# Patient Record
Sex: Male | Born: 1945 | Hispanic: No | Smoking: Former smoker
Health system: Southern US, Community
[De-identification: ages and names within clinical notes are randomized; demographics above are authoritative.]

## PROBLEM LIST (undated history)

## (undated) DIAGNOSIS — S72009A Fracture of unspecified part of neck of unspecified femur, initial encounter for closed fracture: Secondary | ICD-10-CM

## (undated) DIAGNOSIS — K219 Gastro-esophageal reflux disease without esophagitis: Secondary | ICD-10-CM

## (undated) DIAGNOSIS — M549 Dorsalgia, unspecified: Secondary | ICD-10-CM

## (undated) DIAGNOSIS — F419 Anxiety disorder, unspecified: Secondary | ICD-10-CM

## (undated) DIAGNOSIS — N429 Disorder of prostate, unspecified: Secondary | ICD-10-CM

## (undated) DIAGNOSIS — D696 Thrombocytopenia, unspecified: Secondary | ICD-10-CM

## (undated) DIAGNOSIS — I739 Peripheral vascular disease, unspecified: Secondary | ICD-10-CM

## (undated) HISTORY — PX: EYE SURGERY: SHX253

## (undated) HISTORY — DX: Thrombocytopenia, unspecified: D69.6

## (undated) HISTORY — PX: OTHER SURGICAL HISTORY: SHX169

## (undated) HISTORY — DX: Fracture of unspecified part of neck of unspecified femur, initial encounter for closed fracture: S72.009A

## (undated) HISTORY — PX: ANKLE SURGERY: SHX546

---

## 2011-01-07 HISTORY — PX: COLONOSCOPY: SHX174

## 2012-06-19 ENCOUNTER — Emergency Department (HOSPITAL_COMMUNITY): Payer: Medicare (Managed Care)

## 2012-06-19 ENCOUNTER — Encounter (HOSPITAL_COMMUNITY): Payer: Self-pay | Admitting: Physical Medicine and Rehabilitation

## 2012-06-19 ENCOUNTER — Observation Stay (HOSPITAL_COMMUNITY)
Admission: EM | Admit: 2012-06-19 | Discharge: 2012-06-21 | Disposition: A | Discharge status: Home / Self Care | Payer: Medicare (Managed Care) | Attending: Internal Medicine | Admitting: Internal Medicine

## 2012-06-19 DIAGNOSIS — N429 Disorder of prostate, unspecified: Secondary | ICD-10-CM

## 2012-06-19 DIAGNOSIS — R079 Chest pain, unspecified: Principal | ICD-10-CM

## 2012-06-19 DIAGNOSIS — G8929 Other chronic pain: Secondary | ICD-10-CM | POA: Insufficient documentation

## 2012-06-19 DIAGNOSIS — F419 Anxiety disorder, unspecified: Secondary | ICD-10-CM

## 2012-06-19 DIAGNOSIS — F411 Generalized anxiety disorder: Secondary | ICD-10-CM

## 2012-06-19 DIAGNOSIS — R0602 Shortness of breath: Secondary | ICD-10-CM

## 2012-06-19 DIAGNOSIS — I739 Peripheral vascular disease, unspecified: Secondary | ICD-10-CM

## 2012-06-19 DIAGNOSIS — K219 Gastro-esophageal reflux disease without esophagitis: Secondary | ICD-10-CM

## 2012-06-19 DIAGNOSIS — M79609 Pain in unspecified limb: Secondary | ICD-10-CM

## 2012-06-19 DIAGNOSIS — I7300 Raynaud's syndrome without gangrene: Secondary | ICD-10-CM

## 2012-06-19 DIAGNOSIS — K0381 Cracked tooth: Secondary | ICD-10-CM | POA: Insufficient documentation

## 2012-06-19 DIAGNOSIS — R911 Solitary pulmonary nodule: Secondary | ICD-10-CM | POA: Insufficient documentation

## 2012-06-19 HISTORY — DX: Anxiety disorder, unspecified: F41.9

## 2012-06-19 HISTORY — DX: Peripheral vascular disease, unspecified: I73.9

## 2012-06-19 HISTORY — DX: Gastro-esophageal reflux disease without esophagitis: K21.9

## 2012-06-19 HISTORY — DX: Disorder of prostate, unspecified: N42.9

## 2012-06-19 LAB — CBC WITH DIFFERENTIAL/PLATELET
Eosinophils Absolute: 0.1 10*3/uL (ref 0.0–0.7)
Hemoglobin: 14.6 g/dL (ref 13.0–17.0)
Lymphocytes Relative: 26 % (ref 12–46)
Lymphs Abs: 1.3 10*3/uL (ref 0.7–4.0)
MCH: 30.6 pg (ref 26.0–34.0)
Monocytes Relative: 7 % (ref 3–12)
Neutro Abs: 3.3 10*3/uL (ref 1.7–7.7)
Neutrophils Relative %: 65 % (ref 43–77)
Platelets: 152 10*3/uL (ref 150–400)
RBC: 4.77 MIL/uL (ref 4.22–5.81)
WBC: 5 10*3/uL (ref 4.0–10.5)

## 2012-06-19 LAB — BASIC METABOLIC PANEL
BUN: 19 mg/dL (ref 6–23)
Chloride: 101 mEq/L (ref 96–112)
GFR calc non Af Amer: 75 mL/min — ABNORMAL LOW (ref >90)
Glucose, Bld: 100 mg/dL — ABNORMAL HIGH (ref 70–99)
Potassium: 4.6 mEq/L (ref 3.5–5.1)
Sodium: 137 mEq/L (ref 135–145)

## 2012-06-19 LAB — TROPONIN I: Troponin I: <0.3 ng/mL (ref <0.30)

## 2012-06-19 LAB — POCT I-STAT TROPONIN I

## 2012-06-19 LAB — POCT I-STAT 3, ART BLOOD GAS (G3+)
Acid-Base Excess: 1 mmol/L (ref 0.0–2.0)
Bicarbonate: 25.1 mEq/L — ABNORMAL HIGH (ref 20.0–24.0)
Patient temperature: 98.6
pH, Arterial: 7.418 (ref 7.350–7.450)
pO2, Arterial: 87 mmHg (ref 80.0–100.0)

## 2012-06-19 MED ORDER — ENOXAPARIN SODIUM 30 MG/0.3ML ~~LOC~~ SOLN: 30.0000 mg | SUBCUTANEOUS | Status: DC

## 2012-06-19 MED ORDER — GABAPENTIN 600 MG PO TABS
600.0000 mg | ORAL_TABLET | Freq: Every day | ORAL | Status: DC
  Administered 2012-06-19 – 2012-06-20 (×2): 600 mg via ORAL
  Filled 2012-06-19 (×3): qty 1

## 2012-06-19 MED ORDER — GI COCKTAIL ~~LOC~~
30.0000 mL | Freq: Two times a day (BID) | ORAL | Status: DC | PRN
  Administered 2012-06-21: 30 mL via ORAL
  Filled 2012-06-19: qty 30

## 2012-06-19 MED ORDER — ENOXAPARIN SODIUM 40 MG/0.4ML ~~LOC~~ SOLN
40.0000 mg | SUBCUTANEOUS | Status: DC
  Administered 2012-06-19 – 2012-06-20 (×2): 40 mg via SUBCUTANEOUS
  Filled 2012-06-19 (×3): qty 0.4

## 2012-06-19 MED ORDER — IOHEXOL 350 MG/ML SOLN
100.0000 mL | Freq: Once | INTRAVENOUS | Status: AC | PRN
  Administered 2012-06-19: 100 mL via INTRAVENOUS

## 2012-06-19 MED ORDER — ACETAMINOPHEN 325 MG PO TABS
650.0000 mg | ORAL_TABLET | ORAL | Status: DC | PRN
  Administered 2012-06-21: 650 mg via ORAL
  Filled 2012-06-19: qty 2

## 2012-06-19 MED ORDER — PANTOPRAZOLE SODIUM 40 MG PO TBEC
40.0000 mg | DELAYED_RELEASE_TABLET | Freq: Every day | ORAL | Status: DC
  Administered 2012-06-20 – 2012-06-21 (×2): 40 mg via ORAL
  Filled 2012-06-19 (×2): qty 1

## 2012-06-19 MED ORDER — NITROGLYCERIN 0.4 MG SL SUBL: 0.4000 mg | SUBLINGUAL_TABLET | SUBLINGUAL | Status: DC | PRN

## 2012-06-19 MED ORDER — TRAMADOL HCL 50 MG PO TABS
50.0000 mg | ORAL_TABLET | Freq: Four times a day (QID) | ORAL | Status: DC | PRN
  Administered 2012-06-20 – 2012-06-21 (×3): 50 mg via ORAL
  Filled 2012-06-19 (×4): qty 1

## 2012-06-19 MED ORDER — ONDANSETRON HCL 4 MG/2ML IJ SOLN: 4.0000 mg | Freq: Four times a day (QID) | INTRAMUSCULAR | Status: DC | PRN

## 2012-06-19 MED ORDER — SODIUM CHLORIDE 0.9 % IV SOLN
INTRAVENOUS | Status: AC
  Administered 2012-06-19: 21:00:00 via INTRAVENOUS

## 2012-06-19 MED ORDER — ASPIRIN EC 81 MG PO TBEC
81.0000 mg | DELAYED_RELEASE_TABLET | Freq: Every day | ORAL | Status: DC
  Administered 2012-06-20 – 2012-06-21 (×2): 81 mg via ORAL
  Filled 2012-06-19 (×2): qty 1

## 2012-06-19 NOTE — H&P (Signed)
Date: 06/19/2012               Patient Name:  Mark Hansen MRN: [id number]  DOB: Mar 11, 1946 Age / Sex: 67 y.o., male   PCP: Melida Gimenez              Medical Service: Internal Medicine Teaching Service              Attending Physician: Dr. Josem Kaufmann    First Contact: Dr. Collier Bullock Pager: [phone]  Second Contact: Dr. Manson Passey Pager: [phone]            After Hours (After 5p/  First Contact Pager: [phone]  weekends / holidays): Second Contact Pager: [phone]     Chief Complaint: Chest pain  History of Present Illness: Patient is a 67 y.o. male with a PMHx of PVD, anxiety, and GERD who presents to [facility] for evaluation of 3 day history of substernal chest pressure. Patient indicates that initially he had lower left tooth pain. He attempted MoM at home without relief and then took his "nerve medicine" - with initial relief. Then, 3 days ago, he developed intermittent episodes of pressure-like epigastric and chest discomfort occuring about 4-5 times a day, lasting typically 25 minutes at a time. This discomfort occurs during the day and is not experienced nocturnally. He also described associated shortness of breath, that was worse with laying flat. He reports that discomfort is better with eating, taking celebrex, tramadol and taking his nerve medicine. Symptoms not provoked by exertion.  Patient and wife at bedside report anxiety ramping up daily at home due to concern for his infected tooth. Wife is concerned that patient's symptoms caused by panic, as they seem to occur when he is at his most anxious.  He has had no syncope. Reports that he has recently started using bilateral hand dumbells for exercise over the past 2 weeks. Is tolerating 30 min of exercise daily without SOB. Denies N/V or diaphoresis with episodes.  Patient had similar but not as severe symptoms approximately 20 years ago, for which he was evaluated at [facility]. He reports that work up "did not reveal," what was  causing his symptoms. Has not had interim symptoms since.    Review of Systems: Constitutional:  denies fever, chills, diaphoresis, appetite change and fatigue.  HEENT: denies HA, hearing loss, ear pain, congestion, rhinorrhea  Respiratory: admits to SOB, DOE, chest tightness. Denies and wheezing.  Cardiovascular: Denies chest pain, palpitations and leg swelling.  Gastrointestinal: admits to reflux Denies dysphagia, nausea, vomiting, abdominal pain, diarrhea, constipation, blood in stool.  Genitourinary: denies dysuria, urgency, frequency, hematuria, flank pain and difficulty urinating.  Musculoskeletal: denies  myalgias, joint swelling, arthralgias and gait problem.   Skin: admits to rash   Neurological: denies dizziness, syncope, weakness, light-headedness, numbness  Hematological: denies adenopathy, easy bruising   Psychiatric/ Behavioral: admits to increasing anxiety Denies acute stressors, worsening anxiety    Current Outpatient Medications: Current Facility-Administered Medications  Medication Dose Route Frequency Provider Last Rate Last Dose  . 0.9 %  sodium chloride infusion   Intravenous STAT Carleene Cooper III, MD      . acetaminophen (TYLENOL) tablet 650 mg  650 mg Oral Q4H PRN Priscella Mann, DO      . [START ON 06/20/2012] aspirin EC tablet 81 mg  81 mg Oral Daily Maitri S Kalia-Reynolds, DO      . enoxaparin (LOVENOX) injection 30 mg  30 mg Subcutaneous Q24H Maitri S Kalia-Reynolds, DO      .  gabapentin (NEURONTIN) tablet 600 mg  600 mg Oral QHS Maitri S Kalia-Reynolds, DO      . nitroGLYCERIN (NITROSTAT) SL tablet 0.4 mg  0.4 mg Sublingual Q5 min PRN Carleene Cooper III, MD      . ondansetron [facility]) injection 4 mg  4 mg Intravenous Q6H PRN Priscella Mann, DO      . [START ON 06/20/2012] pantoprazole (PROTONIX) EC tablet 40 mg  40 mg Oral Daily Maitri S Kalia-Reynolds, DO      . traMADol (ULTRAM) tablet 50 mg  50 mg Oral Q6H PRN Maitri S Kalia-Reynolds, DO         Allergies: No Known Allergies   Past Medical History: Past Medical History  Diagnosis Date  . Anxiety   . GERD (gastroesophageal reflux disease)   . Peripheral vascular disease   . Prostate disease     Past Surgical History: Past Surgical History  Procedure Laterality Date  . Other surgical history      unspecified type of prostate surgery  . Ankle surgery      bilateral ankle surgery    Family History: Family History  Problem Relation Age of Onset  . CAD Brother 50    requiring bypass  . CAD Mother 67  . Asthma Father   . Kidney disease      cousin    Social History: History   Social History  . Marital Status: Married    Spouse Name: N/A    Number of Children: N/A  . Years of Education: N/A   Occupational History  . Disabled     secondary to accident at work several years ago.   Social History Main Topics  . Smoking status: Former Smoker -- 1.00 packs/day for 35 years    Types: Cigarettes  . Smokeless tobacco: Not on file  . 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  . Drug Use: No  . Sexually Active: Not on file   Other Topics Concern  . Not on file   Social History Narrative   Lives in [place] with his wife. Formerly worked in [employer], commissioning. Emigrated from [place] when he was 67 years old.     Vital Signs: Blood pressure 123/60, pulse 55, temperature 98.1 F (36.7 C), temperature source Oral, resp. rate 18, height 5\' 3"  (1.6 m), weight 127 lb 9.6 oz (57.879 kg), SpO2 100.00%.  Physical Exam: General: Vital signs reviewed and noted. Thin male in no acute distress; alert, appropriate and cooperative throughout examination.  Head: Normocephalic, atraumatic.  Eyes: PERRL, EOMI, No signs of anemia or jaundince.  Mouth/Throat: No abscesses apprecaited. Oropharynx nonerythematous, no exudate appreciated.   Neck: No deformities, masses, or tenderness noted.Supple, no JVD.  Lungs:  Normal respiratory effort.  Clear to auscultation BL without crackles or wheezes.  Trunk: Palpation of chest wall does not reproduce pain. Some pink papules over L lower back, no vesicular changes or TTP.  Heart: RRR. S1 and S2 normal without gallop, murmur, or rubs.  Abdomen:  No TTP of abdomen. BS normoactive. Soft, non distended.  No masses or organomegaly.  Extremities: 2+ DP pulses bilaterally. No pretibial edema.  Neurologic: A&O X3, CN II - XII are grossly intact. Motor strength is 5/5 in the all 4 extremities, Sensations intact to light touch, Cerebellar signs negative.   Lab results:  CURRENT LABS: CBC:    Component Value Date/Time   WBC 5.0 06/19/2012 1352   HGB  14.6 06/19/2012 1352   HCT 40.6 06/19/2012 1352   PLT 152 06/19/2012 1352   MCV 85.1 06/19/2012 1352   NEUTROABS 3.3 06/19/2012 1352   LYMPHSABS 1.3 06/19/2012 1352   MONOABS 0.3 06/19/2012 1352   EOSABS 0.1 06/19/2012 1352   BASOSABS 0.0 06/19/2012 1352     Metabolic Panel:    Component Value Date/Time   NA 137 06/19/2012 1352   K 4.6 06/19/2012 1352   CL 101 06/19/2012 1352   CO2 29 06/19/2012 1352   BUN 19 06/19/2012 1352   CREATININE 1.01 06/19/2012 1352   GLUCOSE 100* 06/19/2012 1352   CALCIUM 9.2 06/19/2012 1352    Troponin (Point of Care Test)  Recent Labs  06/19/12 1410  TROPIPOC 0.00    Imaging results:   Dg Chest 2 View (06/19/2012) - No acute infiltrate or pulmonary edema.  Minimal interstitial prominence bilaterally.   Original Report Authenticated By: Natasha Mead, M.D.    Ct Angio Chest W/cm &/or Wo Cm (06/19/2012) - 1.  No CT findings for pulmonary embolism. 2.  Emphysematous changes but no acute pulmonary findings.   Original Report Authenticated By: Rudie Meyer, M.D.    Other results:  EKG (06/19/2012) - Sinus bradycardia with HR 54, normal intervals, normal axis, TWI in lead AVL, no ST segment elevations/depressions. No prior for comparison  Assessment & Plan:  Pt is a 67 y.o. yo male with a PMHx of anxiety, GERD, and  peripheral vascular diseasewho was admitted on 06/19/2012 with symptoms of chest pressure and SOB, likely noncardiac in nature.    1) Chest pressure/SOB Patient presents with four days of intermittent chest pressure and SOB. Symptoms are nonexertional, and occur most often when recumbent. No EKG changes or elevated troponin to support ACS. He does have risk factors for CAD including age, smoking history, first degree relative with early onset CAD. No infiltrate on CXR, leukocytosis, fever or cough to suggest PNA. No orthopnea, LE edema or PND to support CHF. Patient had CTA in ED which was negative for PE. No evidence of Ludwig's angina, without abscess appreciated, no LAD or tenderness on neck.  Anxiety is likely also contributing to his symptoms, as symptoms are somewhat allayed after taking Klonipin. He and his wife report that his anxiety has been "out of control" over the past 3 days worrying about tooth pain.  Patient does have history of acid reflux and is taking celebrex, which may indicate GERD as diagnosis. His discomfort is primarily epigastric; however, he reports improvement of discomfort with eating. Improvement of pain with food could suggest duodenal ulcer disease. He is also at risk for gastric ulcer given celebrex use and former heavy smoking history. Smoking history, immigration history raise suspicion for H. Pylori. Denies any blood in his stool and no acute abdomen to suggest unstable bleeding ulcer. Of note, patient reports hospitalization in [employer] for similar symptoms 20 years ago, and reports that they "did not find the answer," at that time. Records not immediately available for review. It would be helpful to get in touch with his PCP during office hours to discuss his history and ascertain prior cardiac work-up.  - Admit to telemetry - Cycle CE - NTG prn - GI cocktail, PPI - Get in touch w PCP re: possible testing for H. Pylori as outpatient - PCP is Melida Gimenez, Chair  [facility], [place] [Ph [phone] Would touch base with PCP regarding coordination of further outpatient risk stratification for CAD, including possible treadmill EKG  2) Anxiety Patient cannot remember name of anxiolytic drug that he takes at home. Clonidine was recorded by pharmacy review, but patient is without HTN, suspect he is actually on Klonopin (clonazepam). Reports that he requires prn anxiolytic meds multiple times per day.  Suspect that he has GAD being treated with daily prn BZPs. Maintenance therapy with SSRI or TCA might be preferable.  - [facility] pharmacy closed now, recommend contact in am [phone])) regarding med reconciliation - Consider SSRI or TCA for GAD, caution SSRI use with tramadol.   3) GERD May be contributing to #1. Not on PPI at home. - Start pantoprazole - GI cocktail  4) Foot pain, chronic, in setting of PVD Holding celebrex now due to concern for possible GERD. Will continue neurontin and tramadol.   5) Solitary Pulmonary Nodule A 4 mm peripheral nodule within the posterior left upper lobe was identified during his CTA. Patient is at increased risk of bronchogenic carcinoma given smoking history. As such, follow-up chest CT at 1 year is recommended.  - Will need to communicate need to follow-up CT needs to PCP   DVT PPX - low molecular weight heparin  CODE STATUS - FULL   CONSULTS PLACED - none  DISPO .   Anticipated discharge in approximately 1 day.   The patient does have a current PCP Melida Gimenez, MD) and does not need an [facility] hospital follow-up appointment after discharge.    Is the [facility] hospital follow-up appointment a one-time only appointment? no.  Does the patient have transportation limitations that hinder transportation to clinic appointments? no.  SERVICE NEEDED AT DISCHARGE - TO BE DETERMINED DURING HOSPITAL COURSE         Y = Yes, Blank = No PT:   OT:   RN:   Equipment:   Other:    Signed: Bronson Curb, MD  PGY-1, Internal Medicine Resident Pager: [phone]  (7PM-7A) 06/19/2012, 8:59 PM

## 2012-06-19 NOTE — ED Notes (Signed)
Pt presents to department for evaluation of SOB and diffuse chest pain. Ongoing x4 days. 5/10 chest tightness at the time. Respirations unlabored. Speaking complete sentences. Skin warm and dry. Pt is alert and oriented x4.

## 2012-06-19 NOTE — ED Provider Notes (Signed)
History     CSN: [id number]  Arrival date & time 06/19/12  1343   First MD Initiated Contact with Patient 06/19/12 1513      Chief Complaint  Patient presents with  . Shortness of Breath  . Chest Pain    (Consider location/radiation/quality/duration/timing/severity/associated sxs/prior treatment) HPI Comments: Patient is a 67 year old man who complains of shortness of breath and chest pain for 4 days. He feels a pressure on his heart, like 2 hands holding his heart and putting pressure on it. He does not that he has had a toothache. He has experienced some cold sweats. 3 years ago he was evaluated for chest pain at a hospital in [facility] and apparently the workup was negative at that time. In 2004 he had fractures of both ankles. He used to smoke, but because his toes became blue in cold weather he was told to stop smoking. He notes blueness of his toes and fingers now. He takes Celebrex, clonidine, tramadol for pain, him gabapentin 600 mg at bedtime for pain in his legs. He is followed by Dr. Trina Ao in [facility].  Patient is a 66 y.o. male presenting with shortness of breath and chest pain. The history is provided by the patient and the spouse. No language interpreter was used.  Shortness of Breath Severity:  Moderate Onset quality:  Gradual Duration:  4 days Timing:  Constant Progression:  Worsening Chronicity:  New Context comment:  No specific cause. Relieved by:  Nothing Worsened by:  Nothing tried Ineffective treatments:  None tried Associated symptoms: chest pain   Associated symptoms: no fever   Associated symptoms comment:  He also feels chest pain, like a hand squeezing his heart from beneath, putting pressure on it. This also has been going on for 4 days. Risk factors: no hx of PE/DVT   Chest Pain Associated symptoms: shortness of breath   Associated symptoms: no fever     Past Medical History  Diagnosis Date  . Anxiety      No past surgical history on file.  History reviewed. No pertinent family history.  History  Substance Use Topics  . Smoking status: Never Smoker   . Smokeless tobacco: Not on file  . Alcohol Use: No      Review of Systems  Constitutional: Negative for fever and chills.       He has had some sweating on his forehead symptoms.  HENT: Negative.   Eyes: Negative.   Respiratory: Positive for chest tightness and shortness of breath.   Cardiovascular: Positive for chest pain.  Gastrointestinal: Negative.   Genitourinary: Negative.   Musculoskeletal:       Neuritic pain in ankles, for which he takes gabapentin.  Skin: Positive for color change.       His toes and fingers turn blue with cold exposure.  They are blue today.  Neurological: Negative.  Negative for seizures and syncope.  Psychiatric/Behavioral: The patient is nervous/anxious.     Allergies  Review of patient's allergies indicates no known allergies.  Home Medications   Current Outpatient Rx  Name  Route  Sig  Dispense  Refill  . celecoxib (CELEBREX) 200 MG capsule   Oral   Take 200 mg by mouth daily.         . cloNIDine (CATAPRES) 0.1 MG tablet   Oral   Take 0.1 mg by mouth 3 (three) times daily as needed (Anxiety).         . traMADol (ULTRAM) 50  MG tablet   Oral   Take 50 mg by mouth every 6 (six) hours as needed for pain.           BP 119/63  Pulse 53  Temp(Src) 97.7 F (36.5 C) (Oral)  Resp 20  SpO2 100%  Physical Exam  Nursing note and vitals reviewed. Constitutional: He is oriented to person, place, and time.  Slender elderly man, appears anxious.  HENT:  Head: Normocephalic and atraumatic.  Right Ear: External ear normal.  Left Ear: External ear normal.  Mouth/Throat: Oropharynx is clear and moist.  He has poor dentition with a number of teeth missing, the left lower lateral incisor broken off at the root, and the left lower canine tooth very loose.  No gingival swelling,  redness or tenderness.  Eyes: Conjunctivae and EOM are normal. Pupils are equal, round, and reactive to light. No scleral icterus.  Neck: Normal range of motion. Neck supple. No JVD present.  Cardiovascular: Regular rhythm.   Bradycardic.  Pulmonary/Chest: Effort normal and breath sounds normal. No respiratory distress. He has no wheezes. He has no rales. He exhibits no tenderness.  Abdominal: Soft. Bowel sounds are normal.  Musculoskeletal:  Has cyanosis of fingers and toes.  Has intact radial and dorsalis pedis pulses bilaterally.  He has scars over the medial malleoli of both ankles from prior ORIF of ankle fractures.  No calf tenderness, no Homans' sign.  Lymphadenopathy:    He has no cervical adenopathy.  Neurological: He is alert and oriented to person, place, and time. He has normal reflexes.  Skin: Skin is warm and dry. Rash noted.  He has a sparsely distributed fine red macular rash on the left side of the neck on on the left posterior chest.  Psychiatric:  Anxious, otherwise WNL.    ED Course  Procedures (including critical care time)  Labs Reviewed  BASIC METABOLIC PANEL - Abnormal; Notable for the following:    Glucose, Bld 100 (*)    GFR calc non Af Amer 75 (*)    GFR calc Af Amer 87 (*)    All other components within normal limits  CBC WITH DIFFERENTIAL  POCT I-STAT TROPONIN I   Dg Chest 2 View  06/19/2012  *RADIOLOGY REPORT*  Clinical Data: Chest pain, shortness of breath  CHEST - 2 VIEW  Comparison: None  Findings: Cardiomediastinal silhouette is unremarkable.  No acute infiltrate or pleural effusion.  No pulmonary edema.  Minimal interstitial prominence bilaterally.  IMPRESSION:  No acute infiltrate or pulmonary edema.  Minimal interstitial prominence bilaterally.   Original Report Authenticated By: Natasha Mead, M.D.     Date: 06/19/2012  Rate: 54  Rhythm: sinus bradycardia  QRS Axis: normal  Intervals: normal  ST/T Wave abnormalities: normal  Conduction  Disutrbances:none  Narrative Interpretation: Sinus bradycardia, otherwise WNL  Old EKG Reviewed: none available  3:48 PM Results for orders placed during the hospital encounter of 06/19/12  CBC WITH DIFFERENTIAL      Result Value Range   WBC 5.0  4.0 - 10.5 K/uL   RBC 4.77  4.22 - 5.81 MIL/uL   Hemoglobin 14.6  13.0 - 17.0 g/dL   HCT 16.1  09.6 - 04.5 %   MCV 85.1  78.0 - 100.0 fL   MCH 30.6  26.0 - 34.0 pg   MCHC 36.0  30.0 - 36.0 g/dL   RDW 40.9  81.1 - 91.4 %   Platelets 152  150 - 400 K/uL   Neutrophils Relative 65  43 - 77 %   Neutro Abs 3.3  1.7 - 7.7 K/uL   Lymphocytes Relative 26  12 - 46 %   Lymphs Abs 1.3  0.7 - 4.0 K/uL   Monocytes Relative 7  3 - 12 %   Monocytes Absolute 0.3  0.1 - 1.0 K/uL   Eosinophils Relative 3  0 - 5 %   Eosinophils Absolute 0.1  0.0 - 0.7 K/uL   Basophils Relative 0  0 - 1 %   Basophils Absolute 0.0  0.0 - 0.1 K/uL  BASIC METABOLIC PANEL      Result Value Range   Sodium 137  135 - 145 mEq/L   Potassium 4.6  3.5 - 5.1 mEq/L   Chloride 101  96 - 112 mEq/L   CO2 29  19 - 32 mEq/L   Glucose, Bld 100 (*) 70 - 99 mg/dL   BUN 19  6 - 23 mg/dL   Creatinine, Ser 1.61  0.50 - 1.35 mg/dL   Calcium 9.2  8.4 - 09.6 mg/dL   GFR calc non Af Amer 75 (*) >90 mL/min   GFR calc Af Amer 87 (*) >90 mL/min  POCT I-STAT TROPONIN I      Result Value Range   Troponin i, poc 0.00  0.00 - 0.08 ng/mL   Comment 3            Dg Chest 2 View  06/19/2012  *RADIOLOGY REPORT*  Clinical Data: Chest pain, shortness of breath  CHEST - 2 VIEW  Comparison: None  Findings: Cardiomediastinal silhouette is unremarkable.  No acute infiltrate or pleural effusion.  No pulmonary edema.  Minimal interstitial prominence bilaterally.  IMPRESSION:  No acute infiltrate or pulmonary edema.  Minimal interstitial prominence bilaterally.   Original Report Authenticated By: Natasha Mead, M.D.     Lab workup negative to this point.  Exam shows Raynaud's phenomenon.  Will repeat TNI, get room  air ABG and CT angio of chest to further his workupl  5:17 PM Repeat TNI negative.  ABG's WNL.  CT angio of chest pending.  6:30 PM CT angio of chest shows no PE, does show some emphysematous changes.   He continues to complain of chest pain.  Will ask unassigned medicine to admit him for chest pain observation.   6:44 PM Pt will be admitted to [facility], Dr. Josem Kaufmann attending, to an observation status, to a telemetry bed.  1. Chest pain   2. Shortness of breath   3. Raynaud's phenomenon             Carleene Cooper III, MD 06/19/12 [phone]

## 2012-06-20 DIAGNOSIS — R079 Chest pain, unspecified: Secondary | ICD-10-CM

## 2012-06-20 LAB — HEMOGLOBIN A1C: Hgb A1c MFr Bld: 5.6 % (ref <5.7)

## 2012-06-20 LAB — LIPID PANEL
Cholesterol: 130 mg/dL (ref 0–200)
Triglycerides: 52 mg/dL (ref <150)
VLDL: 10 mg/dL (ref 0–40)

## 2012-06-20 MED ORDER — RANITIDINE HCL 150 MG PO TABS: 150.0000 mg | ORAL_TABLET | Freq: Two times a day (BID) | ORAL | Status: DC

## 2012-06-20 NOTE — Progress Notes (Signed)
Utilization review completed.  

## 2012-06-20 NOTE — Progress Notes (Signed)
Pt sinus brady in the upper 40's while sleeping. Pt asymptomatic at this time. MD on-call made aware. No new orders given. Will continue to monitor.  Harless Litten, RN 06/20/12

## 2012-06-20 NOTE — H&P (Signed)
Internal Medicine Attending Admission Note Date: 06/20/2012  Patient name: Mark Hansen Medical record number: [id number] Date of birth: 1946-01-31 Age: 67 y.o. Gender: male  I saw and evaluated the patient. I reviewed the resident's note and I agree with the resident's findings and plan as documented in the resident's note.  Chief Complaint(s): Chest squeezing x 3 days.  History - key components related to admission:  Mark Hansen is a 67 year old man with a history of peripheral vascular occlusive disease, tobacco abuse, gastroesophageal reflux disease, and anxiety who presents with 3 days of chest squeezing. He was in his usual state of health until 3 days ago when he noted drainage from one of his teeth. He became very nervous about this and subsequently developed heaviness with breathing and a squeezing sensation substernally. The pain did not radiate, was not associated with exertion, diaphoresis, dizziness, or nausea. The frequency of the pain was 4-5 times per day with each episode lasting 25 minutes or so. He had no pain at night. The pain was made worse with lying flat and was improved with eating, taking his tramadol, Celebrex, or Klonopin. He had similar symptoms approximately 20 years ago for which he was evaluated in [place] and no specific etiology was found. Cardiac risk factors include age, gender, history of tobacco abuse, family history of premature coronary artery disease, and presence of peripheral vascular occlusive disease. He denies a personal history of diabetes, hypertension, or hyperlipidemia.  When he was seen this morning he also noted some "heavy breathing" after eating breakfast but this was not associated with chest squeezing or other symptoms. During our conversation he appeared much more comfortable from a breathing standpoint. When I asked about his anxiety he admitted it was quite high given his concern about his tooth. Although he occasionally feels blue he says  this is only intermittent and is currently not the case now. He is without other complaints at this time.  Physical Exam - key components related to admission:  Filed Vitals:   06/19/12 1830 06/19/12 1915 06/19/12 2030 06/20/12 0500  BP: 129/65 123/60 147/79 138/78  Pulse: 59 55 59 58  Temp:   98.2 F (36.8 C) 98.1 F (36.7 C)  TempSrc:      Resp:   18 16  Height:   5\' 3"  (1.6 m)   Weight:   127 lb 9.6 oz (57.879 kg)   SpO2: 100% 100% 99% 96%   Gen.: Well-developed, well-nourished, man lying comfortably in bed in no acute distress. Of note, he is breathing comfortably. Lungs: Clear to auscultation bilaterally without wheezes, rhonchi, or rales. Heart: Regular rate and rhythm without murmurs, rubs, or gallops. Abdomen: Soft, nontender, active bowel sounds. Extremities: 1+ dorsalis pedis pulses bilaterally, warm, without edema.  Lab results:  Basic Metabolic Panel:  Recent Labs  03/47/42 1352 06/19/12 2122  NA 137  --   K 4.6  --   CL 101  --   CO2 29  --   GLUCOSE 100*  --   BUN 19  --   CREATININE 1.01  --   CALCIUM 9.2  --   MG  --  2.0   CBC:  Recent Labs  06/19/12 1352  WBC 5.0  NEUTROABS 3.3  HGB 14.6  HCT 40.6  MCV 85.1  PLT 152   Cardiac Enzymes:  Recent Labs  06/19/12 1552 06/19/12 2122 06/20/12 0303  TROPONINI <0.30 <0.30 <0.30   Fasting Lipid Panel:  Recent Labs  06/20/12 0303  CHOL 130  HDL 41  LDLCALC 79  TRIG 52  CHOLHDL 3.2   Misc. Labs:  Presumably room air arterial blood gas: 7.42/39/87  Total cholesterol 130 Triglycerides 52 HDL 41 LDL 79  Imaging results:  Dg Chest 2 View  06/19/2012  *RADIOLOGY REPORT*  Clinical Data: Chest pain, shortness of breath  CHEST - 2 VIEW  Comparison: None  Findings: Cardiomediastinal silhouette is unremarkable.  No acute infiltrate or pleural effusion.  No pulmonary edema.  Minimal interstitial prominence bilaterally.  IMPRESSION:  No acute infiltrate or pulmonary edema.  Minimal  interstitial prominence bilaterally.   Original Report Authenticated By: Natasha Mead, M.D.    Ct Angio Chest W/cm &/or Wo Cm  06/19/2012  **ADDENDUM** CREATED: 06/19/2012 21:15:30  A 4 mm peripheral nodule within the posterior left upper lobe (image 23) is identified.  If the patient is at high risk for bronchogenic carcinoma, follow- up chest CT at 1 year is recommended.  If the patient is at low risk, no follow-up is needed.  This recommendation follows the consensus statement: Guidelines for Management of Small Pulmonary Nodules Detected on CT Scans:  A Statement from the Fleischner Society as published in Radiology 2005; 237:395-400.  **END ADDENDUM** SIGNED BY: Tinnie Gens T. Si Gaul, M.D.   06/19/2012  *RADIOLOGY REPORT*  Clinical Data: Shortness of breath and chest pain.  CT ANGIOGRAPHY CHEST  Technique:  Multidetector CT imaging of the chest using the standard protocol during bolus administration of intravenous contrast. Multiplanar reconstructed images including MIPs were obtained and reviewed to evaluate the vascular anatomy.  Contrast:  100 ml Omnipaque-300.  Comparison: None  Findings: The chest wall is unremarkable.  No supraclavicular or axillary mass or adenopathy.  The bony thorax is intact.  No destructive bone lesions or spinal canal compromise.  A few small hemangiomas are noted.  The heart is normal in size.  No pericardial effusion.  No mediastinal or hilar lymphadenopathy.  The aorta is normal in caliber.  The esophagus is grossly normal.  The pulmonary arterial tree is well opacified.  No filling defects to suggest pulmonary emboli.  Examination of the lung parenchyma demonstrates biapical scarring type changes.  There are mild underlying emphysematous changes.  No infiltrates, edema or effusions.  Patchy areas of subpleural atelectasis are noted.  The upper abdomen is unremarkable. A few tiny low attenuation liver lesions are likely benign cysts.  IMPRESSION:  1.  No CT findings for pulmonary  embolism. 2.  Emphysematous changes but no acute pulmonary findings.   Original Report Authenticated By: Rudie Meyer, M.D.    Other results:  EKG: Normal sinus bradycardia at 54 beats per minute, normal axis and intervals, small Q's in V1 through V2, no LVH, no ST-T changes, no comparisons available.  Assessment & Plan by Problem:  Mr. Mccadden is a 67 year old man with a history of peripheral vascular occlusive disease, tobacco abuse, gastroesophageal reflux disease, and anxiety who presents with a three-day history of chest squeezing associated with concern over a tooth. The chest pain is very atypical for a cardiac cause. The most likely cause of the pain and the heavy breathing is anxiety. That being said, he does have a coronary equivalent in his peripheral vascular occlusive disease as well as other cardiac risk factors and deserves risk stratification.  1) Chest pain: He has ruled out for a myocardial infarction with serial enzymes and ECGs. We will try to obtain an exercise treadmill to risk stratify him. If we cannot do this while an inpatient we will  recommend this be done as an outpatient if he and his primary care provider feel it is necessary. Because our concerns that this episode represents a cardiac cause is low we will not start aspirin, statins, or antianginals.  2) Anxiety: We will continue the Klonopin. Ideally, he may benefit from an SSRI. Unfortunately he is currently on tramadol and the combination would be the risk for serotonin syndrome if we added an SSRI. As the tramadol seems to be effective in controlling his foot pain where other medications were not, we will not start an SSRI at this time. His anxiety can be managed as an outpatient by his primary care provider.  3) Pleural-based nodule: 4 mm in size. As he has a history of smoking he is at higher risk for bronchogenic carcinoma. Therefore, per the Fleischner criteria, he should have a repeat CT scan in one year to  assure this is not a growing lesion. If there is no change in the 4 mm size at that time no further workup will be necessary. He has already quit smoking.  4) Disposition: If we are able to get the exercise treadmill today to risk stratify we will do so. If we cannot, he will be discharged home with followup in his primary care provider's office. Further decisions on risk stratification can be done there.

## 2012-06-20 NOTE — Discharge Summary (Signed)
Internal Medicine Teaching [facility] Discharge Note  Name: Mark Hansen MRN: [id number] DOB: [birth year] 67 y.o.  Date of Admission: 06/19/2012  1:52 PM Date of Discharge: 06/21/2012 Attending Physician: Rocco Serene, MD  Discharge Diagnosis: Principal Problem:   Chest pain Active Problems:   Anxiety   GERD (gastroesophageal reflux disease)   Peripheral vascular disease   Discharge Medications:   Medication List    TAKE these medications       celecoxib 200 MG capsule  Commonly known as:  CELEBREX  Take 200 mg by mouth daily.     gabapentin 600 MG tablet  Commonly known as:  NEURONTIN  Take 600 mg by mouth at bedtime.     pantoprazole 40 MG tablet  Commonly known as:  PROTONIX  Take 1 tablet (40 mg total) by mouth daily.     traMADol 50 MG tablet  Commonly known as:  ULTRAM  Take 50 mg by mouth every 6 (six) hours as needed for pain.        Disposition and follow-up:   Mr.Mark Hansen was discharged from [facility] in Stable condition.  At the hospital follow up visit please address the following  -Followup chest pain admission with risk stratification -GERD treatment and possible H. pylori breath test if symptoms persist -Anxiety management, possible SSRI -Followup pulmonary nodule and discuss with patient, recommend chest CT in 1 year per guidelines   Follow-up Appointments:     Follow-up Information   Follow up with Melida Gimenez, MD. (please call to make a hospital follow up appointment in 1-2 weeks)    Contact information:   [address] [place] [address] [phone]      Discharge Orders   Future Orders Complete By Expires     Diet - low sodium heart healthy  As directed     Increase activity slowly  As directed        Consultations:    Procedures Performed:  Dg Chest 2 View  06/19/2012  *RADIOLOGY REPORT*  Clinical Data: Chest pain, shortness of breath  CHEST - 2 VIEW  Comparison: None   Findings: Cardiomediastinal silhouette is unremarkable.  No acute infiltrate or pleural effusion.  No pulmonary edema.  Minimal interstitial prominence bilaterally.  IMPRESSION:  No acute infiltrate or pulmonary edema.  Minimal interstitial prominence bilaterally.   Original Report Authenticated By: Natasha Mead, M.D.    Ct Angio Chest W/cm &/or Wo Cm  06/19/2012  **ADDENDUM** CREATED: 06/19/2012 21:15:30  A 4 mm peripheral nodule within the posterior left upper lobe (image 23) is identified.  If the patient is at high risk for bronchogenic carcinoma, follow- up chest CT at 1 year is recommended.  If the patient is at low risk, no follow-up is needed.  This recommendation follows the consensus statement: Guidelines for Management of Small Pulmonary Nodules Detected on CT Scans:  A Statement from the Fleischner Society as published in Radiology 2005; 237:395-400.  **END ADDENDUM** SIGNED BY: Tinnie Gens T. Si Gaul, M.D.   06/19/2012  *RADIOLOGY REPORT*  Clinical Data: Shortness of breath and chest pain.  CT ANGIOGRAPHY CHEST  Technique:  Multidetector CT imaging of the chest using the standard protocol during bolus administration of intravenous contrast. Multiplanar reconstructed images including MIPs were obtained and reviewed to evaluate the vascular anatomy.  Contrast:  100 ml Omnipaque-300.  Comparison: None  Findings: The chest wall is unremarkable.  No supraclavicular or axillary mass or adenopathy.  The bony thorax is intact.  No  destructive bone lesions or spinal canal compromise.  A few small hemangiomas are noted.  The heart is normal in size.  No pericardial effusion.  No mediastinal or hilar lymphadenopathy.  The aorta is normal in caliber.  The esophagus is grossly normal.  The pulmonary arterial tree is well opacified.  No filling defects to suggest pulmonary emboli.  Examination of the lung parenchyma demonstrates biapical scarring type changes.  There are mild underlying emphysematous changes.  No infiltrates,  edema or effusions.  Patchy areas of subpleural atelectasis are noted.  The upper abdomen is unremarkable. A few tiny low attenuation liver lesions are likely benign cysts.  IMPRESSION:  1.  No CT findings for pulmonary embolism. 2.  Emphysematous changes but no acute pulmonary findings.   Original Report Authenticated By: Rudie Meyer, M.D.      Admission HPI: Patient is a 67 y.o. male with a PMHx of PVD, anxiety, and GERD who presents to [facility] for evaluation of 3 day history of substernal chest pressure. Patient indicates that initially he had lower left tooth pain. He attempted MoM at home without relief and then took his "nerve medicine" - with initial relief. Then, 3 days ago, he developed intermittent episodes of pressure-like epigastric and chest discomfort occuring about 4-5 times a day, lasting typically 25 minutes at a time. This discomfort occurs during the day and is not experienced nocturnally. He also described associated shortness of breath, that was worse with laying flat. He reports that discomfort is better with eating, taking celebrex, tramadol and taking his nerve medicine. Symptoms not provoked by exertion.  Patient and wife at bedside report anxiety ramping up daily at home due to concern for his infected tooth. Wife is concerned that patient's symptoms caused by panic, as they seem to occur when he is at his most anxious.  He has had no syncope. Reports that he has recently started using bilateral hand dumbells for exercise over the past 2 weeks. Is tolerating 30 min of exercise daily without SOB. Denies N/V or diaphoresis with episodes.  Patient had similar but not as severe symptoms approximately 20 years ago, for which he was evaluated at [facility]. He reports that work up "did not reveal," what was causing his symptoms. Has not had interim symptoms since.    Hospital Course by problem list: Principal Problem:   Chest pain Active Problems:   Anxiety   GERD  (gastroesophageal reflux disease)   Peripheral vascular disease    Chest pressure/SOB  Patient presented with chest pain and subjective shortness of breath. Serial cardiac enzymes were negative, EKGs were unrevealing for ST elevations or ischemic changes. Exercise stress test performed during this hospitalization was negative. Likely etiology is anxiety with some GERD symptoms.   Anxiety  Patient and his wife state that his anxiety has gotten worse recently. This is likely a major contributor factor to his presentation. He states he is on home klonopin. He will followup with his PCP for further management of his anxiety. May benefit from an SSRI for long-term treatment.  GERD  Patient had some burning chest pain as well as brackish taste in his mouth suggesting that GERD may be contributing to his presentation. He was started on pantoprazole during this hospitalization, and he was given a prescription to continue this medication at discharge. He should followup with his PCP for determining the treatment duration and possible further work up for GI causes of chest pain.   Solitary Pulmonary Nodule  A 4 mm  peripheral nodule within the posterior left upper lobe was identified on CT angiogram during the workup for patient's chest pain. Patient is at increased risk of bronchogenic carcinoma given smoking history. As such, follow-up chest CT at 1 year is recommended. We discussed these results with the patient.  Toothache  Patient presented with a broken tooth that is causing him significant discomfort. He does not have signs of infection or abscess. He should followup with a dentist immediately after discharge for repair or extraction of the affected tooth.   Discharge Vitals:  BP 171/85  Pulse 65  Temp(Src) 97.6 F (36.4 C) (Oral)  Resp 18  Ht 5\' 3"  (1.6 m)  Wt 126 lb 12.8 oz (57.516 kg)  BMI 22.47 kg/m2  SpO2 100%   Signed: Denton Ar 06/21/2012, 10:38 AM   Time Spent on Discharge:  35 minutes Services Ordered on Discharge: None Equipment Ordered on Discharge: None

## 2012-06-21 MED ORDER — TRAMADOL HCL 50 MG PO TABS
50.0000 mg | ORAL_TABLET | Freq: Once | ORAL | Status: AC
  Administered 2012-06-21: 50 mg via ORAL

## 2012-06-21 MED ORDER — CLONAZEPAM 0.5 MG PO TABS: 0.5000 mg | ORAL_TABLET | Freq: Once | ORAL | Status: DC | PRN

## 2012-06-21 MED ORDER — HYDROCODONE-ACETAMINOPHEN 5-325 MG PO TABS
1.0000 | ORAL_TABLET | Freq: Once | ORAL | Status: AC
  Administered 2012-06-21: 1 via ORAL
  Filled 2012-06-21: qty 1

## 2012-06-21 MED ORDER — PANTOPRAZOLE SODIUM 40 MG PO TBEC: 40.0000 mg | DELAYED_RELEASE_TABLET | Freq: Every day | ORAL | Status: DC

## 2012-06-21 MED ORDER — ALUM & MAG HYDROXIDE-SIMETH 200-200-20 MG/5ML PO SUSP
30.0000 mL | Freq: Once | ORAL | Status: AC
  Administered 2012-06-21: 30 mL via ORAL
  Filled 2012-06-21: qty 30

## 2012-06-21 NOTE — Progress Notes (Signed)
Reviewed discharge instructions with patient and wife, they stated their understanding.  Patient given information on indigestion and reinforced education of orange juice increasing acid and causing indigestion.  Patient discharged via wheelchair by volunteers home with wife.  Colman Cater

## 2012-06-21 NOTE — Progress Notes (Signed)
Internal Medicine Attending  Date: 06/21/2012  Patient name: Mark Hansen Medical record number: [id number] Date of birth: 1945/09/13 Age: 67 y.o. Gender: male  I saw and evaluated the patient. I reviewed the resident's note by Dr. Collier Bullock and I agree with the resident's findings and plans as documented in her progress note.  [name] felt well this morning. His exercise treadmill EKG test was without evidence of reversible ischemia. Therefore, his symptoms are most likely related to gastroesophageal reflux disease and overlying anxiety. He is stable for discharge this morning with followup in his primary care provider's office to address the reflux, anxiety, and very small pulmonary nodule.

## 2012-06-21 NOTE — Progress Notes (Signed)
Subjective: Patient developed a headache overnight. He states that he also had some right upper quadrant pain, sour taste in his mouth, and burning epigastric pain. Patient got a GI cocktail and Ultram, which alleviated the pain a little bit.  Denies chest pain, shortness of breath.  Objective: Vital signs in last 24 hours: Filed Vitals:   06/20/12 0500 06/20/12 1500 06/20/12 2100 06/21/12 0420  BP: 138/78 131/75 135/80 171/85  Pulse: 58 66 66 65  Temp: 98.1 F (36.7 C) 98.4 F (36.9 C) 98.3 F (36.8 C) 97.6 F (36.4 C)  TempSrc:  Oral Oral Oral  Resp: 16 18 15 18   Height:      Weight:    126 lb 12.8 oz (57.516 kg)  SpO2: 96% 99% 100% 100%   Weight change: -12.8 oz (-0.363 kg)  Intake/Output Summary (Last 24 hours) at 06/21/12 0847 Last data filed at 06/20/12 1900  Gross per 24 hour  Intake    300 ml  Output    350 ml  Net    -50 ml    Physical Exam Blood pressure 171/85, pulse 65, temperature 97.6 F (36.4 C), temperature source Oral, resp. rate 18, height 5\' 3"  (1.6 m), weight 126 lb 12.8 oz (57.516 kg), SpO2 100.00%. General:  No acute distress, alert and oriented x 3, well-appearing  HEENT:  PERRL, EOMI, moist mucous membranes, poor dentition with broken tooth and exposed root. No erythema or purulent drainage, no abscess. Cardiovascular:  Regular rate and rhythm, no murmurs, rubs or gallops Respiratory:  Clear to auscultation bilaterally, no wheezes, rales, or rhonchi Abdomen:  Soft, nondistended, nontender, bowel sounds present Extremities:  Warm and well-perfused, no edema. 2+ pulses. Skin: Warm, dry, no rashes Neuro: normal affect, slightly anxious appearing  Lab Results: Basic Metabolic Panel:  Recent Labs Lab 06/19/12 1352 06/19/12 2122  NA 137  --   K 4.6  --   CL 101  --   CO2 29  --   GLUCOSE 100*  --   BUN 19  --   CREATININE 1.01  --   CALCIUM 9.2  --   MG  --  2.0   CBC:  Recent Labs Lab 06/19/12 1352  WBC 5.0  NEUTROABS 3.3  HGB  14.6  HCT 40.6  MCV 85.1  PLT 152   Cardiac Enzymes:  Recent Labs Lab 06/19/12 2122 06/20/12 0303 06/20/12 1010  TROPONINI <0.30 <0.30 <0.30   Hemoglobin A1C:  Recent Labs Lab 06/19/12 2122  HGBA1C 5.6   Fasting Lipid Panel:  Recent Labs Lab 06/20/12 0303  CHOL 130  HDL 41  LDLCALC 79  TRIG 52  CHOLHDL 3.2   Thyroid Function Tests:  Recent Labs Lab 06/19/12 2122  TSH 3.177    Studies/Results: Dg Chest 2 View  06/19/2012  *RADIOLOGY REPORT*  Clinical Data: Chest pain, shortness of breath  CHEST - 2 VIEW  Comparison: None  Findings: Cardiomediastinal silhouette is unremarkable.  No acute infiltrate or pleural effusion.  No pulmonary edema.  Minimal interstitial prominence bilaterally.  IMPRESSION:  No acute infiltrate or pulmonary edema.  Minimal interstitial prominence bilaterally.   Original Report Authenticated By: Natasha Mead, M.D.    Ct Angio Chest W/cm &/or Wo Cm  06/19/2012  **ADDENDUM** CREATED: 06/19/2012 21:15:30  A 4 mm peripheral nodule within the posterior left upper lobe (image 23) is identified.  If the patient is at high risk for bronchogenic carcinoma, follow- up chest CT at 1 year is recommended.  If the patient is  at low risk, no follow-up is needed.  This recommendation follows the consensus statement: Guidelines for Management of Small Pulmonary Nodules Detected on CT Scans:  A Statement from the Fleischner Society as published in Radiology 2005; 237:395-400.  **END ADDENDUM** SIGNED BY: Tinnie Gens T. Si Gaul, M.D.   06/19/2012  *RADIOLOGY REPORT*  Clinical Data: Shortness of breath and chest pain.  CT ANGIOGRAPHY CHEST  Technique:  Multidetector CT imaging of the chest using the standard protocol during bolus administration of intravenous contrast. Multiplanar reconstructed images including MIPs were obtained and reviewed to evaluate the vascular anatomy.  Contrast:  100 ml Omnipaque-300.  Comparison: None  Findings: The chest wall is unremarkable.  No  supraclavicular or axillary mass or adenopathy.  The bony thorax is intact.  No destructive bone lesions or spinal canal compromise.  A few small hemangiomas are noted.  The heart is normal in size.  No pericardial effusion.  No mediastinal or hilar lymphadenopathy.  The aorta is normal in caliber.  The esophagus is grossly normal.  The pulmonary arterial tree is well opacified.  No filling defects to suggest pulmonary emboli.  Examination of the lung parenchyma demonstrates biapical scarring type changes.  There are mild underlying emphysematous changes.  No infiltrates, edema or effusions.  Patchy areas of subpleural atelectasis are noted.  The upper abdomen is unremarkable. A few tiny low attenuation liver lesions are likely benign cysts.  IMPRESSION:  1.  No CT findings for pulmonary embolism. 2.  Emphysematous changes but no acute pulmonary findings.   Original Report Authenticated By: Rudie Meyer, M.D.    Medications:  Medications reviewed  Scheduled Meds: . aspirin EC  81 mg Oral Daily  . enoxaparin (LOVENOX) injection  40 mg Subcutaneous Q24H  . gabapentin  600 mg Oral QHS  . pantoprazole  40 mg Oral Daily   Continuous Infusions:  PRN Meds:.acetaminophen, gi cocktail, nitroGLYCERIN, ondansetron (ZOFRAN) IV, traMADol  Assessment/Plan:  Pt is a 67 y.o. yo male with a PMHx of anxiety, GERD, and peripheral vascular diseasewho was admitted on 06/19/2012 with symptoms of chest pressure and SOB, likely noncardiac in nature.   1) Chest pressure/SOB  Exercise stress test was negative, CE negative. Likely etiology is anxiety with some GERD.  -dc today with protonix, back on home anxiety medications -PCP follow up next week - PCP is Melida Gimenez, [facility], [place] [Ph [phone]   2) Anxiety  Home klonopin  Further management per PCP  3) GERD  May be contributing to #1. Not on PPI at home. Had a sour taste and burning pain last night likely from GERD.  - Will  give Rx for pantoprazole  - f/u PCP  4) Foot pain, chronic, in setting of PVD  Will continue neurontin and tramadol.   5) Solitary Pulmonary Nodule  A 4 mm peripheral nodule within the posterior left upper lobe was identified during his CTA. Patient is at increased risk of bronchogenic carcinoma given smoking history. As such, follow-up chest CT at 1 year is recommended.  - Will need to communicate need to follow-up CT needs to PCP  6) Toothache Patient has a broken tooth that is causing him significant discomfort. He does not have signs of infection or abscess.   -f/u with dentist for repair  DVT PPX Lovenox  Dispo -dc today with PCP and dentistry follow up    LOS: 2 days   Denton Ar 06/21/2012, 8:47 AM

## 2012-06-21 NOTE — Progress Notes (Signed)
Pt complaining of "sour" taste and upper chest "burning". Pt given GI cocktail and ultram. Pt then complained of RUQ stabbing pain and "coolness" in his throat. Temp 97.6, BP 171/85, HR 65 NSR, O2 sats 100% on RA. MD on-call paged and made aware. Order given to give PRN tylenol, one time dose of 50mg  ultram PO, and to obtain AM EKG early. Pt stated that his pain decreased. Will continue to monitor.  Harless Litten, RN 06/21/12

## 2013-04-30 ENCOUNTER — Emergency Department (HOSPITAL_COMMUNITY)
Admission: EM | Admit: 2013-04-30 | Discharge: 2013-04-30 | Disposition: A | Discharge status: Home / Self Care | Payer: Medicare (Managed Care) | Attending: Emergency Medicine | Admitting: Emergency Medicine

## 2013-04-30 ENCOUNTER — Encounter (HOSPITAL_COMMUNITY): Payer: Self-pay | Admitting: Emergency Medicine

## 2013-04-30 DIAGNOSIS — M545 Low back pain, unspecified: Secondary | ICD-10-CM | POA: Insufficient documentation

## 2013-04-30 DIAGNOSIS — Z87891 Personal history of nicotine dependence: Secondary | ICD-10-CM | POA: Insufficient documentation

## 2013-04-30 DIAGNOSIS — F3289 Other specified depressive episodes: Secondary | ICD-10-CM | POA: Insufficient documentation

## 2013-04-30 DIAGNOSIS — Z87448 Personal history of other diseases of urinary system: Secondary | ICD-10-CM | POA: Insufficient documentation

## 2013-04-30 DIAGNOSIS — Z79899 Other long term (current) drug therapy: Secondary | ICD-10-CM | POA: Insufficient documentation

## 2013-04-30 DIAGNOSIS — Z8679 Personal history of other diseases of the circulatory system: Secondary | ICD-10-CM | POA: Insufficient documentation

## 2013-04-30 DIAGNOSIS — K219 Gastro-esophageal reflux disease without esophagitis: Secondary | ICD-10-CM | POA: Insufficient documentation

## 2013-04-30 DIAGNOSIS — G8929 Other chronic pain: Secondary | ICD-10-CM | POA: Insufficient documentation

## 2013-04-30 DIAGNOSIS — Z8739 Personal history of other diseases of the musculoskeletal system and connective tissue: Secondary | ICD-10-CM | POA: Insufficient documentation

## 2013-04-30 DIAGNOSIS — F329 Major depressive disorder, single episode, unspecified: Secondary | ICD-10-CM | POA: Insufficient documentation

## 2013-04-30 DIAGNOSIS — M549 Dorsalgia, unspecified: Secondary | ICD-10-CM

## 2013-04-30 HISTORY — DX: Dorsalgia, unspecified: M54.9

## 2013-04-30 MED ORDER — CELECOXIB 200 MG PO CAPS: 200.0000 mg | ORAL_CAPSULE | Freq: Every day | ORAL | Status: DC

## 2013-04-30 MED ORDER — MELOXICAM 7.5 MG PO TABS: 7.5000 mg | ORAL_TABLET | Freq: Every day | ORAL | Status: DC

## 2013-04-30 MED ORDER — OXYCODONE-ACETAMINOPHEN 5-325 MG PO TABS: 1.0000 | ORAL_TABLET | ORAL | Status: DC | PRN

## 2013-04-30 NOTE — Discharge Instructions (Signed)
Back Pain, Adult Low back pain is very common. About 1 in 5 people have back pain.The cause of low back pain is rarely dangerous. The pain often gets better over time.About half of people with a sudden onset of back pain feel better in just 2 weeks. About 8 in 10 people feel better by 6 weeks.  CAUSES Some common causes of back pain include:  Strain of the muscles or ligaments supporting the spine.  Wear and tear (degeneration) of the spinal discs.  Arthritis.  Direct injury to the back. DIAGNOSIS Most of the time, the direct cause of low back pain is not known.However, back pain can be treated effectively even when the exact cause of the pain is unknown.Answering your caregiver's questions about your overall health and symptoms is one of the most accurate ways to make sure the cause of your pain is not dangerous. If your caregiver needs more information, he or she may order lab work or imaging tests (X-rays or MRIs).However, even if imaging tests show changes in your back, this usually does not require surgery. HOME CARE INSTRUCTIONS For many people, back pain returns.Since low back pain is rarely dangerous, it is often a condition that people can learn to manageon their own.   Remain active. It is stressful on the back to sit or stand in one place. Do not sit, drive, or stand in one place for more than 30 minutes at a time. Take short walks on level surfaces as soon as pain allows.Try to increase the length of time you walk each day.  Do not stay in bed.Resting more than 1 or 2 days can delay your recovery.  Do not avoid exercise or work.Your body is made to move.It is not dangerous to be active, even though your back may hurt.Your back will likely heal faster if you return to being active before your pain is gone.  Pay attention to your body when you bend and lift. Many people have less discomfortwhen lifting if they bend their knees, keep the load close to their bodies,and  avoid twisting. Often, the most comfortable positions are those that put less stress on your recovering back.  Find a comfortable position to sleep. Use a firm mattress and lie on your side with your knees slightly bent. If you lie on your back, put a pillow under your knees.  Only take over-the-counter or prescription medicines as directed by your caregiver. Over-the-counter medicines to reduce pain and inflammation are often the most helpful.Your caregiver may prescribe muscle relaxant drugs.These medicines help dull your pain so you can more quickly return to your normal activities and healthy exercise.  Put ice on the injured area.  Put ice in a plastic bag.  Place a towel between your skin and the bag.  Leave the ice on for 15-20 minutes, 03-04 times a day for the first 2 to 3 days. After that, ice and heat may be alternated to reduce pain and spasms.  Ask your caregiver about trying back exercises and gentle massage. This may be of some benefit.  Avoid feeling anxious or stressed.Stress increases muscle tension and can worsen back pain.It is important to recognize when you are anxious or stressed and learn ways to manage it.Exercise is a great option. SEEK MEDICAL CARE IF:  You have pain that is not relieved with rest or medicine.  You have pain that does not improve in 1 week.  You have new symptoms.  You are generally not feeling well. SEEK   IMMEDIATE MEDICAL CARE IF:   You have pain that radiates from your back into your legs.  You develop new bowel or bladder control problems.  You have unusual weakness or numbness in your arms or legs.  You develop nausea or vomiting.  You develop abdominal pain.  You feel faint. Document Released: 04/13/2005 Document Revised: 10/13/2011 Document Reviewed: 09/01/2010 [employer] Patient Information 2014 [employer].  

## 2013-04-30 NOTE — ED Provider Notes (Signed)
CSN: [id number]     Arrival date & time 04/30/13  1122 History  This chart was scribed for non-physician practitioner, Elpidio AnisShari Keya Wynes, PA-C working with Raeford RazorStephen Kohut, MD by Greggory StallionKayla Andersen, ED scribe. This patient was seen in room WTR6/WTR6 and the patient's care was started at 12:40 PM.    Chief Complaint  Patient presents with  . Back Pain   The history is provided by the patient. No language interpreter was used.   HPI Comments: Mark Hansen is a 68 y.o. male who presents to the Emergency Department complaining of chronic lower back pain that has been ongoing for several years. He states he stopped taking the medications he was given from pain management 3 months ago. Pt states he started taking celebrex instead with relief but can no longer afford it. He takes gabapentin with some relief. Pt states the pain is worse in the morning. Hunching over can relieve some pain. Denies abdominal pain, bowel or bladder incontinence.    Past Medical History  Diagnosis Date  . Anxiety   . GERD (gastroesophageal reflux disease)   . Peripheral vascular disease   . Prostate disease   . Back pain    Past Surgical History  Procedure Laterality Date  . Other surgical history      unspecified type of prostate surgery  . Ankle surgery      bilateral ankle surgery   Family History  Problem Relation Age of Onset  . CAD Brother 50    requiring bypass  . CAD Mother 1855  . Asthma Father   . Kidney disease      cousin   History  Substance Use Topics  . Smoking status: Former Smoker -- 1.00 packs/day for 35 years    Types: Cigarettes  . Smokeless tobacco: Not on file  . 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    Review of Systems  Gastrointestinal: Negative for abdominal pain.  Genitourinary:       Negative for bowel or bladder incontinence.   Musculoskeletal: Positive for back pain and myalgias.  All other systems reviewed and are  negative.    Allergies  Review of patient's allergies indicates no known allergies.  Home Medications   Current Outpatient Rx  Name  Route  Sig  Dispense  Refill  . gabapentin (NEURONTIN) 600 MG tablet   Oral   Take 600 mg by mouth at bedtime.         . pantoprazole (PROTONIX) 40 MG tablet   Oral   Take 1 tablet (40 mg total) by mouth daily.   30 tablet   3   . traMADol (ULTRAM) 50 MG tablet   Oral   Take 50 mg by mouth every 6 (six) hours as needed for pain.          BP 133/99  Pulse 66  Temp(Src) 97.4 F (36.3 C) (Oral)  Resp 20  SpO2 100%  Physical Exam  Nursing note and vitals reviewed. Constitutional: He is oriented to person, place, and time. He appears well-developed and well-nourished.  Uncomfortable appearing, pacing, standing, leaning on furniture.  HENT:  Head: Normocephalic and atraumatic.  Eyes: EOM are normal.  Neck: Neck supple. No tracheal deviation present.  Cardiovascular: Normal rate.   Pulmonary/Chest: Effort normal. No respiratory distress.  Abdominal: Soft. There is no tenderness.  Musculoskeletal: Normal range of motion.  Non tender back. No swelling. Fully weight bearing.   Neurological:  He is alert and oriented to person, place, and time.  Skin: Skin is warm and dry.  Psychiatric: He has a normal mood and affect. His behavior is normal.    ED Course  Procedures (including critical care time)  DIAGNOSTIC STUDIES: Oxygen Saturation is 100% on RA, normal by my interpretation.    COORDINATION OF CARE: 12:47 PM-Discussed treatment plan which includes short course pain medication and trying to find an alternative to celebrex with pt at bedside and pt agreed to plan.   Labs Review Labs Reviewed - No data to display Imaging Review No results found.  EKG Interpretation   None       MDM  No diagnosis found. 1. Back pain, recurrent  Anti-inflammatories given for pain - generic Celebrex written, along with Mobic if Celebrex  unobtainable due to financial reasons. Percocet for interim relief.  I personally performed the services described in this documentation, which was scribed in my presence. The recorded information has been reviewed and is accurate.    Arnoldo Hooker, PA-C 05/13/13 [phone]

## 2013-04-30 NOTE — ED Notes (Signed)
Pt states he has chronic lower back pain. Is unable to afford celebrex which helps with back pain, as a result pain returned. Pt states pain is worse in am, pt hunches over in order to relieve pain.

## 2013-05-13 NOTE — ED Provider Notes (Signed)
Medical screening examination/treatment/procedure(s) were performed by non-physician practitioner and as supervising physician I was immediately available for consultation/collaboration.  EKG Interpretation   None        Aimie Wagman, MD 05/13/13 2339 

## 2013-05-28 ENCOUNTER — Emergency Department (HOSPITAL_COMMUNITY)
Admission: EM | Admit: 2013-05-28 | Discharge: 2013-05-28 | Disposition: A | Discharge status: Home / Self Care | Payer: Medicare (Managed Care) | Attending: Emergency Medicine | Admitting: Emergency Medicine

## 2013-05-28 ENCOUNTER — Encounter (HOSPITAL_COMMUNITY): Payer: Self-pay | Admitting: Emergency Medicine

## 2013-05-28 DIAGNOSIS — Z87891 Personal history of nicotine dependence: Secondary | ICD-10-CM | POA: Insufficient documentation

## 2013-05-28 DIAGNOSIS — Z8719 Personal history of other diseases of the digestive system: Secondary | ICD-10-CM | POA: Insufficient documentation

## 2013-05-28 DIAGNOSIS — L02519 Cutaneous abscess of unspecified hand: Secondary | ICD-10-CM | POA: Insufficient documentation

## 2013-05-28 DIAGNOSIS — L03011 Cellulitis of right finger: Secondary | ICD-10-CM

## 2013-05-28 DIAGNOSIS — F411 Generalized anxiety disorder: Secondary | ICD-10-CM | POA: Insufficient documentation

## 2013-05-28 DIAGNOSIS — I731 Thromboangiitis obliterans [Buerger's disease]: Secondary | ICD-10-CM

## 2013-05-28 DIAGNOSIS — I7300 Raynaud's syndrome without gangrene: Secondary | ICD-10-CM

## 2013-05-28 DIAGNOSIS — L03019 Cellulitis of unspecified finger: Secondary | ICD-10-CM | POA: Insufficient documentation

## 2013-05-28 DIAGNOSIS — Z791 Long term (current) use of non-steroidal anti-inflammatories (NSAID): Secondary | ICD-10-CM | POA: Insufficient documentation

## 2013-05-28 DIAGNOSIS — Z87448 Personal history of other diseases of urinary system: Secondary | ICD-10-CM | POA: Insufficient documentation

## 2013-05-28 DIAGNOSIS — Z79899 Other long term (current) drug therapy: Secondary | ICD-10-CM | POA: Insufficient documentation

## 2013-05-28 MED ORDER — CEPHALEXIN 500 MG PO CAPS: 500.0000 mg | ORAL_CAPSULE | Freq: Three times a day (TID) | ORAL | Status: DC

## 2013-05-28 MED ORDER — CEPHALEXIN 500 MG PO CAPS
500.0000 mg | ORAL_CAPSULE | Freq: Once | ORAL | Status: AC
  Administered 2013-05-28: 500 mg via ORAL
  Filled 2013-05-28: qty 1

## 2013-05-28 MED ORDER — ACETAMINOPHEN-CODEINE #3 300-30 MG PO TABS
2.0000 | ORAL_TABLET | Freq: Once | ORAL | Status: AC
  Administered 2013-05-28: 2 via ORAL
  Filled 2013-05-28: qty 2

## 2013-05-28 MED ORDER — ACETAMINOPHEN-CODEINE #3 300-30 MG PO TABS
1.0000 | ORAL_TABLET | Freq: Four times a day (QID) | ORAL | Status: DC | PRN
Start: 2013-05-28 — End: 2017-02-05

## 2013-05-28 NOTE — ED Provider Notes (Signed)
CSN: [id number]     Arrival date & time 05/28/13  0756 History   First MD Initiated Contact with Patient 05/28/13 [phone]     Chief Complaint  Patient presents with  . Hand Problem    right   (Consider location/radiation/quality/duration/timing/severity/associated sxs/prior Treatment) HPI Comments: Pt is here due to worsening swelling to middle 3 fingers of right hand.  Pt has generalized pain, fingers and hands are quite cold.  Skin is reddish more than left hand.  Pt is focally tender in one area of medial middle finger where he has seen some clear and white fluid drain.  He admits he has very dry skin and skin cracks and breaks often.  Pt has almost FROM, denies worse pain with bending and extension, but cannot make a complete fist due to the degree of swelling.  He was told a few years ago when similar happened to feet and toes, that he was told he must stop smoking or else they would have to amputate his toes. Pt has quit smoking completely since then.  No fevers, chills.  He reports when the weather is cold, his fingers and toes sometimes change colors.    The history is provided by the patient.    Past Medical History  Diagnosis Date  . Anxiety   . GERD (gastroesophageal reflux disease)   . Peripheral vascular disease   . Prostate disease   . Back pain    Past Surgical History  Procedure Laterality Date  . Other surgical history      unspecified type of prostate surgery  . Ankle surgery      bilateral ankle surgery   Family History  Problem Relation Age of Onset  . CAD Brother 50    requiring bypass  . CAD Mother 75  . Asthma Father   . Kidney disease      cousin   History  Substance Use Topics  . Smoking status: Former Smoker -- 1.00 packs/day for 35 years    Types: Cigarettes  . Smokeless tobacco: Not on file  . 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    Review of Systems  Constitutional: Negative for fever and chills.    Musculoskeletal: Positive for arthralgias and joint swelling.  Skin: Positive for color change and wound. Negative for rash.  Neurological: Negative for weakness and numbness.    Allergies  Review of patient's allergies indicates no known allergies.  Home Medications   Current Outpatient Rx  Name  Route  Sig  Dispense  Refill  . celecoxib (CELEBREX) 200 MG capsule   Oral   Take 1 capsule (200 mg total) by mouth daily.   30 capsule   2   . gabapentin (NEURONTIN) 600 MG tablet   Oral   Take 600 mg by mouth at bedtime.         Marland Kitchen acetaminophen-codeine (TYLENOL #3) 300-30 MG per tablet   Oral   Take 1-2 tablets by mouth every 6 (six) hours as needed for moderate pain.   15 tablet   0   . cephALEXin (KEFLEX) 500 MG capsule   Oral   Take 1 capsule (500 mg total) by mouth 3 (three) times daily.   30 capsule   0    BP 109/93  Pulse 70  Temp(Src) 97.5 F (36.4 C) (Oral)  Resp 17  SpO2 99% Physical Exam  Nursing note and vitals reviewed. Constitutional: He is  oriented to person, place, and time. He appears well-developed and well-nourished. No distress.  HENT:  Head: Normocephalic and atraumatic.  Eyes: Conjunctivae are normal. No scleral icterus.  Pulmonary/Chest: Effort normal. No respiratory distress.  Abdominal: Soft.  Musculoskeletal:       Right wrist: Normal. He exhibits normal range of motion and no tenderness.       Hands: Pt has a ring on 5th finger of right hand, I recommended to pt that he remove it.  2+ RP on both UE  Neurological: He is alert and oriented to person, place, and time.  Skin: Skin is warm. He is not diaphoretic. No pallor.    ED Course  Procedures (including critical care time) Labs Review Labs Reviewed - No data to display Imaging Review No results found.  EKG Interpretation   None      RA sat is 99% and I interpret to be normal    MDM   1. Buerger's disease   2. Raynaud disease   3. 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  Pt has quit smoking completely.  Pt may have Renaud's phenomen as well.  Pt needs to keep limbs warm, follow up with PCP and hand surgeon.  Will put pt on antibiotics for possible cellulitis, i don't see a specific abscess or area requiring immediate drainage. Only 2 portions of Kanavel's signs seen, held in partial flexion, swelling, but there is no tenderness over flexor sheath in particular, and no worsening pain with passive extension.        Shekinah Pitones Y. Oletta LamasGhGavin Poundim, MD 05/28/13 [phone]

## 2013-05-28 NOTE — ED Notes (Signed)
Pt denies injury to right hand but states hand has began to swell x 1 week. Pt states he has been putting Vaseline and peroxide on hands. Pt states pain in hand is a 6/10

## 2013-05-28 NOTE — Discharge Instructions (Signed)
Cellulitis Cellulitis is an infection of the skin and the tissue beneath it. The infected area is usually red and tender. Cellulitis occurs most often in the arms and lower legs.  CAUSES  Cellulitis is caused by bacteria that enter the skin through cracks or cuts in the skin. The most common types of bacteria that cause cellulitis are Staphylococcus and Streptococcus. SYMPTOMS   Redness and warmth.  Swelling.  Tenderness or pain.  Fever. DIAGNOSIS  Your caregiver can usually determine what is wrong based on a physical exam. Blood tests may also be done. TREATMENT  Treatment usually involves taking an antibiotic medicine. HOME CARE INSTRUCTIONS   Take your antibiotics as directed. Finish them even if you start to feel better.  Keep the infected arm or leg elevated to reduce swelling.  Apply a warm cloth to the affected area up to 4 times per day to relieve pain.  Only take over-the-counter or prescription medicines for pain, discomfort, or fever as directed by your caregiver.  Keep all follow-up appointments as directed by your caregiver. SEEK MEDICAL CARE IF:   You notice red streaks coming from the infected area.  Your red area gets larger or turns dark in color.  Your bone or joint underneath the infected area becomes painful after the skin has healed.  Your infection returns in the same area or another area.  You notice a swollen bump in the infected area.  You develop new symptoms. SEEK IMMEDIATE MEDICAL CARE IF:   You have a fever.  You feel very sleepy.  You develop vomiting or diarrhea.  You have a general ill feeling (malaise) with muscle aches and pains. MAKE SURE YOU:   Understand these instructions.  Will watch your condition.  Will get help right away if you are not doing well or get worse. Document Released: 01/21/2005 Document Revised: 10/13/2011 Document Reviewed: 06/29/2011 [place] Patient Information 2014 [employer], [place].    Raynaud's  Syndrome Raynaud's Syndrome is a disorder of the blood vessels in your hands and feet. It occurs when small arteries of the arms/hands or legs/feet become sensitive to cold or emotional upset. This causes the arteries to constrict, or narrow, and reduces blood flow to the area. The color in the fingers or toes changes from white to bluish to red and this is not usually painful. There may be numbness and tingling. Sores on the skin (ulcers) can form. Symptoms are usually relieved by warming. HOME CARE INSTRUCTIONS   Avoid exposure to cold. Keep your whole body warm and dry. Dress in layers. Wear mittens or gloves when handling ice or frozen food and when outdoors. Use holders for glasses or cans containing cold drinks. If possible, stay indoors during cold weather.  Limit your use of caffeine. Switch to decaffeinated coffee, tea, and soda pop. Avoid chocolate.  Avoid smoking or being around cigarette smoke. Smoke will make symptoms worse.  Wear loose fitting socks and comfortable, roomy shoes.  Avoid vibrating tools and machinery.  If possible, avoid stressful and emotional situations. Exercise, meditation and yoga may help you cope with stress. Biofeedback may be useful.  Ask your caregiver about medicine (calcium channel blockers) that may control Raynaud's phenomena. SEEK MEDICAL CARE IF:   Your discomfort becomes worse, despite conservative treatment.  You develop sores on your fingers and toes that do not heal. Document Released: 04/10/2000 Document Revised: 07/06/2011 Document Reviewed: 04/17/2008 [facility]eExitCare Patient Information 2014 [place]Care, Maryland[employer].   Narcotic and benzodiazepine use may cause drowsiness, slowed breathing or  dependence.  Please use with caution and do not drive, operate machinery or watch young children alone while taking them.  Taking combinations of these medications or drinking alcohol will potentiate these effects.

## 2017-02-05 ENCOUNTER — Emergency Department (HOSPITAL_COMMUNITY): Payer: Medicare (Managed Care)

## 2017-02-05 ENCOUNTER — Inpatient Hospital Stay (HOSPITAL_COMMUNITY)
Admission: EM | Admit: 2017-02-05 | Discharge: 2017-02-08 | DRG: 481 | Disposition: A | Discharge status: Skilled Nursing Facility | Payer: Medicare (Managed Care) | Attending: Internal Medicine | Admitting: Internal Medicine

## 2017-02-05 ENCOUNTER — Inpatient Hospital Stay (HOSPITAL_COMMUNITY): Payer: Medicare (Managed Care)

## 2017-02-05 ENCOUNTER — Encounter (HOSPITAL_COMMUNITY)
Admission: EM | Disposition: A | Discharge status: Skilled Nursing Facility | Payer: Self-pay | Source: Home / Self Care | Attending: Internal Medicine

## 2017-02-05 ENCOUNTER — Emergency Department (HOSPITAL_COMMUNITY): Payer: Medicare (Managed Care) | Admitting: Anesthesiology

## 2017-02-05 ENCOUNTER — Encounter (HOSPITAL_COMMUNITY): Payer: Self-pay

## 2017-02-05 DIAGNOSIS — Z87891 Personal history of nicotine dependence: Secondary | ICD-10-CM | POA: Diagnosis not present

## 2017-02-05 DIAGNOSIS — S72143A Displaced intertrochanteric fracture of unspecified femur, initial encounter for closed fracture: Principal | ICD-10-CM | POA: Diagnosis present

## 2017-02-05 DIAGNOSIS — D696 Thrombocytopenia, unspecified: Secondary | ICD-10-CM | POA: Diagnosis present

## 2017-02-05 DIAGNOSIS — Z419 Encounter for procedure for purposes other than remedying health state, unspecified: Secondary | ICD-10-CM

## 2017-02-05 DIAGNOSIS — Y92009 Unspecified place in unspecified non-institutional (private) residence as the place of occurrence of the external cause: Secondary | ICD-10-CM

## 2017-02-05 DIAGNOSIS — Z7982 Long term (current) use of aspirin: Secondary | ICD-10-CM | POA: Diagnosis not present

## 2017-02-05 DIAGNOSIS — Z01818 Encounter for other preprocedural examination: Secondary | ICD-10-CM

## 2017-02-05 DIAGNOSIS — S72002A Fracture of unspecified part of neck of left femur, initial encounter for closed fracture: Secondary | ICD-10-CM

## 2017-02-05 DIAGNOSIS — R17 Unspecified jaundice: Secondary | ICD-10-CM | POA: Diagnosis not present

## 2017-02-05 DIAGNOSIS — W07XXXA Fall from chair, initial encounter: Secondary | ICD-10-CM | POA: Diagnosis present

## 2017-02-05 DIAGNOSIS — D62 Acute posthemorrhagic anemia: Secondary | ICD-10-CM | POA: Diagnosis not present

## 2017-02-05 DIAGNOSIS — I739 Peripheral vascular disease, unspecified: Secondary | ICD-10-CM | POA: Diagnosis present

## 2017-02-05 DIAGNOSIS — S72009A Fracture of unspecified part of neck of unspecified femur, initial encounter for closed fracture: Secondary | ICD-10-CM

## 2017-02-05 DIAGNOSIS — S72012A Unspecified intracapsular fracture of left femur, initial encounter for closed fracture: Secondary | ICD-10-CM | POA: Diagnosis present

## 2017-02-05 DIAGNOSIS — J449 Chronic obstructive pulmonary disease, unspecified: Secondary | ICD-10-CM | POA: Diagnosis present

## 2017-02-05 HISTORY — DX: Fracture of unspecified part of neck of unspecified femur, initial encounter for closed fracture: S72.009A

## 2017-02-05 HISTORY — DX: Thrombocytopenia, unspecified: D69.6

## 2017-02-05 HISTORY — PX: FEMUR IM NAIL: SHX1597

## 2017-02-05 LAB — PROTIME-INR
INR: 0.99
PROTHROMBIN TIME: 13 s (ref 11.4–15.2)

## 2017-02-05 LAB — COMPREHENSIVE METABOLIC PANEL
ALT: 19 U/L (ref 17–63)
AST: 22 U/L (ref 15–41)
Albumin: 4.2 g/dL (ref 3.5–5.0)
Alkaline Phosphatase: 65 U/L (ref 38–126)
Anion gap: 7 (ref 5–15)
BILIRUBIN TOTAL: 2.5 mg/dL — AB (ref 0.3–1.2)
BUN: 17 mg/dL (ref 6–20)
CO2: 30 mmol/L (ref 22–32)
CREATININE: 0.87 mg/dL (ref 0.61–1.24)
Calcium: 8.7 mg/dL — ABNORMAL LOW (ref 8.9–10.3)
Chloride: 103 mmol/L (ref 101–111)
Glucose, Bld: 101 mg/dL — ABNORMAL HIGH (ref 65–99)
POTASSIUM: 3.9 mmol/L (ref 3.5–5.1)
Sodium: 140 mmol/L (ref 135–145)
TOTAL PROTEIN: 6.4 g/dL — AB (ref 6.5–8.1)

## 2017-02-05 LAB — CBC
HEMATOCRIT: 46.3 % (ref 39.0–52.0)
Hemoglobin: 15.8 g/dL (ref 13.0–17.0)
MCH: 29.3 pg (ref 26.0–34.0)
MCHC: 34.1 g/dL (ref 30.0–36.0)
MCV: 85.9 fL (ref 78.0–100.0)
Platelets: 138 10*3/uL — ABNORMAL LOW (ref 150–400)
RBC: 5.39 MIL/uL (ref 4.22–5.81)
RDW: 12.5 % (ref 11.5–15.5)
WBC: 7 10*3/uL (ref 4.0–10.5)

## 2017-02-05 SURGERY — INSERTION, INTRAMEDULLARY ROD, FEMUR
Anesthesia: General | Site: Hip | Laterality: Left

## 2017-02-05 MED ORDER — SODIUM CHLORIDE 0.9 % IV SOLN
INTRAVENOUS | Status: DC
  Administered 2017-02-05 – 2017-02-06 (×3): via INTRAVENOUS

## 2017-02-05 MED ORDER — FENTANYL CITRATE (PF) 100 MCG/2ML IJ SOLN
25.0000 ug | INTRAMUSCULAR | Status: DC | PRN
  Administered 2017-02-05 (×3): 25 ug via INTRAVENOUS
  Administered 2017-02-05: 50 ug via INTRAVENOUS

## 2017-02-05 MED ORDER — SODIUM CHLORIDE 0.9 % IR SOLN
Status: DC | PRN
  Administered 2017-02-05: 1000 mL

## 2017-02-05 MED ORDER — METHOCARBAMOL 500 MG PO TABS
500.0000 mg | ORAL_TABLET | Freq: Four times a day (QID) | ORAL | Status: DC | PRN
  Administered 2017-02-06: 500 mg via ORAL
  Filled 2017-02-05: qty 1

## 2017-02-05 MED ORDER — FENTANYL CITRATE (PF) 100 MCG/2ML IJ SOLN
INTRAMUSCULAR | Status: DC | PRN
  Administered 2017-02-05 (×2): 50 ug via INTRAVENOUS

## 2017-02-05 MED ORDER — OXYCODONE HCL 5 MG PO TABS
5.0000 mg | ORAL_TABLET | ORAL | Status: DC | PRN
  Administered 2017-02-06 (×2): 5 mg via ORAL
  Filled 2017-02-05 (×2): qty 1

## 2017-02-05 MED ORDER — ONDANSETRON HCL 4 MG/2ML IJ SOLN
4.0000 mg | Freq: Four times a day (QID) | INTRAMUSCULAR | Status: DC | PRN
  Administered 2017-02-06 – 2017-02-07 (×2): 4 mg via INTRAVENOUS
  Filled 2017-02-05 (×3): qty 2

## 2017-02-05 MED ORDER — MORPHINE SULFATE (PF) 4 MG/ML IV SOLN
2.0000 mg | INTRAVENOUS | Status: DC | PRN
Start: 2017-02-05 — End: 2017-02-06
  Administered 2017-02-05: 22:00:00 2 mg via INTRAVENOUS
  Filled 2017-02-05: qty 1

## 2017-02-05 MED ORDER — FENTANYL CITRATE (PF) 100 MCG/2ML IJ SOLN
INTRAMUSCULAR | Status: AC
  Filled 2017-02-05: qty 2

## 2017-02-05 MED ORDER — CEFAZOLIN SODIUM-DEXTROSE 1-4 GM/50ML-% IV SOLN
1.0000 g | Freq: Four times a day (QID) | INTRAVENOUS | Status: AC
  Administered 2017-02-05 – 2017-02-06 (×3): 1 g via INTRAVENOUS
  Filled 2017-02-05 (×3): qty 50

## 2017-02-05 MED ORDER — MIDAZOLAM HCL 2 MG/2ML IJ SOLN
INTRAMUSCULAR | Status: AC
  Filled 2017-02-05: qty 2

## 2017-02-05 MED ORDER — PHENYLEPHRINE HCL 10 MG/ML IJ SOLN
INTRAMUSCULAR | Status: DC | PRN
  Administered 2017-02-05 (×2): 80 ug via INTRAVENOUS

## 2017-02-05 MED ORDER — ONDANSETRON HCL 4 MG/2ML IJ SOLN
4.0000 mg | Freq: Once | INTRAMUSCULAR | Status: AC
  Administered 2017-02-05: 4 mg via INTRAVENOUS
  Filled 2017-02-05: qty 2

## 2017-02-05 MED ORDER — DEXTROSE 5 % IV SOLN
500.0000 mg | Freq: Four times a day (QID) | INTRAVENOUS | Status: DC | PRN
  Administered 2017-02-05: 500 mg via INTRAVENOUS
  Filled 2017-02-05: qty 5

## 2017-02-05 MED ORDER — ACETAMINOPHEN 10 MG/ML IV SOLN
INTRAVENOUS | Status: DC | PRN
  Administered 2017-02-05: 1000 mg via INTRAVENOUS

## 2017-02-05 MED ORDER — PROPOFOL 10 MG/ML IV BOLUS
INTRAVENOUS | Status: DC | PRN
  Administered 2017-02-05: 120 mg via INTRAVENOUS

## 2017-02-05 MED ORDER — LIDOCAINE 2% (20 MG/ML) 5 ML SYRINGE
INTRAMUSCULAR | Status: DC | PRN
  Administered 2017-02-05: 60 mg via INTRAVENOUS

## 2017-02-05 MED ORDER — FENTANYL CITRATE (PF) 100 MCG/2ML IJ SOLN
100.0000 ug | INTRAMUSCULAR | Status: DC | PRN
  Administered 2017-02-05 – 2017-02-07 (×3): 100 ug via INTRAVENOUS
  Filled 2017-02-05 (×3): qty 2

## 2017-02-05 MED ORDER — ACETAMINOPHEN 650 MG RE SUPP: 650.0000 mg | Freq: Four times a day (QID) | RECTAL | Status: DC | PRN

## 2017-02-05 MED ORDER — ONDANSETRON HCL 4 MG/2ML IJ SOLN
INTRAMUSCULAR | Status: DC | PRN
  Administered 2017-02-05: 4 mg via INTRAVENOUS

## 2017-02-05 MED ORDER — ENOXAPARIN SODIUM 30 MG/0.3ML ~~LOC~~ SOLN
30.0000 mg | SUBCUTANEOUS | Status: DC
Start: 2017-02-06 — End: 2017-02-08
  Administered 2017-02-06 – 2017-02-08 (×3): 30 mg via SUBCUTANEOUS
  Filled 2017-02-05 (×3): qty 0.3

## 2017-02-05 MED ORDER — PROPOFOL 10 MG/ML IV BOLUS
INTRAVENOUS | Status: AC
  Filled 2017-02-05: qty 20

## 2017-02-05 MED ORDER — EPHEDRINE SULFATE 50 MG/ML IJ SOLN
INTRAMUSCULAR | Status: DC | PRN
  Administered 2017-02-05: 5 mg via INTRAVENOUS
  Administered 2017-02-05: 10 mg via INTRAVENOUS

## 2017-02-05 MED ORDER — ACETAMINOPHEN 325 MG PO TABS: 650.0000 mg | ORAL_TABLET | Freq: Four times a day (QID) | ORAL | Status: DC | PRN

## 2017-02-05 MED ORDER — HYDROCODONE-ACETAMINOPHEN 5-325 MG PO TABS
1.0000 | ORAL_TABLET | ORAL | Status: DC | PRN
Start: 2017-02-05 — End: 2017-02-06

## 2017-02-05 MED ORDER — HYDROCODONE-ACETAMINOPHEN 5-325 MG PO TABS: 1.0000 | ORAL_TABLET | Freq: Four times a day (QID) | ORAL | 0 refills | Status: DC | PRN

## 2017-02-05 MED ORDER — ONDANSETRON HCL 4 MG PO TABS: 4.0000 mg | ORAL_TABLET | Freq: Four times a day (QID) | ORAL | Status: DC | PRN

## 2017-02-05 MED ORDER — ACETAMINOPHEN 10 MG/ML IV SOLN
INTRAVENOUS | Status: AC
  Filled 2017-02-05: qty 100

## 2017-02-05 MED ORDER — CEFAZOLIN SODIUM-DEXTROSE 2-3 GM-% IV SOLR
INTRAVENOUS | Status: DC | PRN
  Administered 2017-02-05: 2 g via INTRAVENOUS

## 2017-02-05 MED ORDER — DEXAMETHASONE SODIUM PHOSPHATE 10 MG/ML IJ SOLN
INTRAMUSCULAR | Status: DC | PRN
  Administered 2017-02-05: 10 mg via INTRAVENOUS

## 2017-02-05 MED ORDER — CEFAZOLIN SODIUM-DEXTROSE 2-4 GM/100ML-% IV SOLN
INTRAVENOUS | Status: AC
  Filled 2017-02-05: qty 100

## 2017-02-05 MED ORDER — METOCLOPRAMIDE HCL 5 MG PO TABS: 5.0000 mg | ORAL_TABLET | Freq: Three times a day (TID) | ORAL | Status: DC | PRN

## 2017-02-05 MED ORDER — METOCLOPRAMIDE HCL 5 MG/ML IJ SOLN: 5.0000 mg | Freq: Three times a day (TID) | INTRAMUSCULAR | Status: DC | PRN

## 2017-02-05 MED ORDER — LACTATED RINGERS IV SOLN
INTRAVENOUS | Status: DC | PRN
  Administered 2017-02-05: 18:00:00 via INTRAVENOUS

## 2017-02-05 MED ORDER — SUCCINYLCHOLINE CHLORIDE 200 MG/10ML IV SOSY
PREFILLED_SYRINGE | INTRAVENOUS | Status: DC | PRN
  Administered 2017-02-05: 100 mg via INTRAVENOUS

## 2017-02-05 MED ORDER — ASPIRIN EC 81 MG PO TBEC: 81.0000 mg | DELAYED_RELEASE_TABLET | Freq: Two times a day (BID) | ORAL | 0 refills | Status: AC

## 2017-02-05 SURGICAL SUPPLY — 25 items
BIT DRILL FLUTED FEMUR 4.2/3 (BIT) ×3 IMPLANT
COVER PERINEAL POST (MISCELLANEOUS) ×3 IMPLANT
COVER SURGICAL LIGHT HANDLE (MISCELLANEOUS) ×3 IMPLANT
DRAPE C-ARM 42X120 X-RAY (DRAPES) ×3 IMPLANT
DRAPE INCISE IOBAN 66X45 STRL (DRAPES) ×3 IMPLANT
DRAPE STERI IOBAN 125X83 (DRAPES) ×3 IMPLANT
DURAPREP 26ML APPLICATOR (WOUND CARE) ×3 IMPLANT
ELECT REM PT RETURN 15FT ADLT (MISCELLANEOUS) ×3 IMPLANT
GAUZE SPONGE 4X4 12PLY STRL (GAUZE/BANDAGES/DRESSINGS) ×3 IMPLANT
GAUZE XEROFORM 1X8 LF (GAUZE/BANDAGES/DRESSINGS) ×3 IMPLANT
GLOVE BIO SURGEON STRL SZ7.5 (GLOVE) ×3 IMPLANT
GLOVE INDICATOR 8.0 STRL GRN (GLOVE) ×3 IMPLANT
GOWN STRL REUS W/TWL LRG LVL3 (GOWN DISPOSABLE) ×3 IMPLANT
GUIDEWIRE 3.2X400 (WIRE) ×6 IMPLANT
KIT BASIN OR (CUSTOM PROCEDURE TRAY) ×3 IMPLANT
MANIFOLD NEPTUNE II (INSTRUMENTS) ×3 IMPLANT
NAIL TROCH FIX 10X170 130 (Nail) ×3 IMPLANT
PACK GENERAL/GYN (CUSTOM PROCEDURE TRAY) ×3 IMPLANT
POSITIONER SURGICAL ARM (MISCELLANEOUS) ×3 IMPLANT
SCREW LOCKING 5.0X34MM (Screw) ×3 IMPLANT
SCREW TFNA HIP 85 STRL (Screw) ×3 IMPLANT
STAPLER VISISTAT 35W (STAPLE) ×3 IMPLANT
SUT VIC AB 2-0 CT1 27 (SUTURE) ×2
SUT VIC AB 2-0 CT1 TAPERPNT 27 (SUTURE) ×1 IMPLANT
TOWEL OR 17X26 10 PK STRL BLUE (TOWEL DISPOSABLE) ×3 IMPLANT

## 2017-02-05 NOTE — Anesthesia Procedure Notes (Addendum)
Procedure Name: Intubation Date/Time: 02/05/2017 6:13 PM Performed by: West Pugh Pre-anesthesia Checklist: Patient identified, Emergency Drugs available, Suction available, Patient being monitored and Timeout performed Patient Re-evaluated:Patient Re-evaluated prior to induction Oxygen Delivery Method: Circle system utilized Preoxygenation: Pre-oxygenation with 100% oxygen Induction Type: IV induction Ventilation: Mask ventilation without difficulty Laryngoscope Size: Mac and 3 Grade View: Grade II Tube type: Oral Tube size: 7.5 mm Number of attempts: 1 Airway Equipment and Method: Stylet Placement Confirmation: ETT inserted through vocal cords under direct vision,  positive ETCO2,  CO2 detector and breath sounds checked- equal and bilateral Secured at: 21 cm Tube secured with: Tape Dental Injury: Teeth and Oropharynx as per pre-operative assessment

## 2017-02-05 NOTE — Progress Notes (Signed)
PACU NURSING NOTE: Addendum; Hospital MD paged to inform of findings during in and out cath. Dr. Rogers not paged. Mark Hansen return call from [facility] MD

## 2017-02-05 NOTE — ED Triage Notes (Addendum)
Per [facility]- Pt on stool and fell sideways and fell onto left side resulting in left hip pain. Pt presents with left hip pain, bulge to left femur area. IV established Fentanyl 100 mcg IVP given. No other complaints  [facility] cleared. Denies NO LOC. Pulses presents.

## 2017-02-05 NOTE — ED Notes (Signed)
SURGICAL OR MADE AWARE PT IS READY. CONSENT NOT SIGNED. ORTHOArtis Delay MD HAS NOT SEEN PT AND FAMILY TO DISCUSS PROCEDURE. CONSENT AVAILABLE FOR SIGNATURE. PT AND FAMILY AWARE OF NEXT PLAN OF CARE AND ADMISSION.  PT TRANSFERRED WITH TRANSPORTED BARRY. NO COMPLAINTS UPON TRANSFER. BELONGINGS WITH DAUGHTER.

## 2017-02-05 NOTE — Progress Notes (Signed)
Ortho Plan:  I have discussed this case with the ER physician and reviewed images.  We will plan for ORIF tonight at [facility] [place] [facility].  Please keep patient NPO.  I will see soon.  Yolonda Kida

## 2017-02-05 NOTE — Progress Notes (Signed)
PACU NURSING NOTE: Pt began complaining of a lot of discomfort from being unable to void post operatively, suprapubic area palpated noted to be very firm and pt c/o worse pain while palpating area. Bladder scan performed immediately, noted to read 484mls. Order rec from Anesthesiologist to In & Out Cath pt, Craige Cott(409[facility](517) 55404535530-DeltonLif0865Reliant EnerLindustries LLC Dba Seventh Ave Surgery CentergyCentral Virginia Surgi Center LP Dba Surgi Center Of CLenward86 CLazaru010La Porte HospiSystems develoTresa Endo1eTurner 40981ni408-Mallie 5HDario GuardiaRiverwoods [phone] [facility]443130419[facility]6BuRayfield C69itiLeepV5Fou52r Hi2840Molli W amCraige Cott651[facility] Syst701-3553123209-DeltonAth0865Reliant EnerWhite Flint Surgery LLCgySt. EliLenward22 CLazaru010Baltimore Ambulatory Center For EndoscSystems develoTresa Endo1eTurner 40981ni(402)Mallie 93HDario GuardiaAnderson Regional Medical Center So63Mayo Clinic Health System-Kentuc(636) [phone]e Re234White Fence Surgical Suites LLC79-24BuRayfield C5itiBuxtV178SaHi2882Molli U RoCraige Cott517Select [facility]ty [facility] Central Pennsylvania Camp Hi(207)5628716509-DeltonLewi0865Reliant EnerMed Atlantic IncgyMount Desert Lenward51 CLazaru010Menifee Valley Medical CenSystems develoTresa Endo3eTurner 40981ni269-Mallie 67HDario GuardiaAssociated Eye Surgical Center Encompass Health Rehabilitation [phone] Of DKentuckyeserOchsSaMelrosewkfld Healthcare Melr -WCraige Cott731Bergenpassaic Cataract Laser And Surgery Center L(843) 53196445956-DeltonBon Sec[address] Fr0865Reliant EnerBon Secours Health Center At Harbour ViewgyEastwiLenward48 CLazaru010Cape Cod Eye Surgery And Laser CenSystems develoTresa Endo2eTurner 40981ni340 Mallie 58HDario [phone] CoSt Marys Surgica130880519Falmouth Hospitalield C83itiSterli60V7Hi6850Molli Kendall Poi SCraige Cott(331[facility] - Psychiatric Support Cent256-53818694469-DeltonGastroenter0865Reliant EnerGastro Surgi Center Of New JerseygySentaraLenward17 CLazaru010New York Presbyterian Hospital - Allen HospiSystems develoTresa Endo3eTurner 40981ni(308)Mallie 39HDario GuardiaBehavioral Medicine At Renaiss([phone] Endoscopy Cent13432Richland Parish Hospital - DelhiRayfield C30itiHogansvilV4E75veHi(3701Molli ymCraige Cott(414)Oklahoma Outpatient Surgery Limited Partnersh470-275064140DeltonMayo Clinic Health Sy0865Reliant EnerLancaster General HospitalgyCentracare HeaLenward20 CLazaru010Thomas Eye Surgery Center Systems develoTresa Endo9eTurner 40981ni(646)Mallie 71HDario [phone] Of Las Vegas43West Valley MeKentuckydicaN MiCraige Cott832Surgery Center At Regency Pa424 3144593216-Delt0865Reliant EnerGenesys Surgery CentergyCedaLenward54 CLazaru010East Bay Division - Martinez Outpatient CliSystems develoTresa Endo7eTurner 40981ni(343)Mallie 25HDario GuardiaVibra Hospital Of Sacrame43Chi Health Creighton University Medical - KentuckyBergTexas Rehabilitation Hospital Of Fort WorthKentucky4540130(Lamount 16109Turtl akCraige Cott(959Va Nebraska-Western Iowa Health Care Syst985 53052305564-DeltonMemorialcare Miller Childrens0865Reliant EnerSummit Surgery Center LLCgyKindred HosLenward80 CLazaru010Presentation Medical CenSystems develoTresa Endo2eTurner 40981ni(319) Mallie 63HDario GuardiaAkron Gener([phone] MOcean Endosurgery C132Oxford Eye Surgery Center LPuRayfield C52itiBurbaV5Parad35isHi6957Molli neCraige Cott(940)Copper Basin Medical Cent801 [phone](281) DeltonAdams County Reg0865Reliant EnerMayo Clinic Health System- Chippewa Valley IncgyMercy Health MuskegLenward37 CLazaru010Mississippi Valley Endoscopy CenS775 506 241([phone] Hosp732-776-55Laj478-41Ad130861726-68Mease Dunedin HospitalC73itiGarden Gr35oHi5991Molli Hancock Region SuCraige Cott440Goodland Regional Medical Cent585-2700663660 Del0865Reliant EnerEast Orange General HospitalgyTeton ValLenward16 CLazaru010Focus Hand Surgicenter Systems develoTresa Endo4eTurner 40981ni720 Mallie 94HDario GuardiaUniversity [phone] Outpatient Surgical 412G.V. (Sonny) Montgomery [facility](717) 63BuRayfield C40itiBailey Lak18V6Hi298 liCraige Cott606Fairfield Memorial Hospit([phone](480)DeltonTinley 0865Reliant EnerPrisma Health Baptist Easley HospitalgyCorrect Care OfLenward46 CLazaru010Presentation Medical CenSystems develoTresa Endo8eTurner 40981ni(519)Mallie 85HDario Gu514-544-561684-687-7886rdiGso Equipment Corp Dba The Oregon Clinic Endoscopy Center New(352)20480Bradley County [facility] Center5BuRayfield C59itiEllsworV5A56rtHi8043Molli CCraige Cott(340Grady Memorial Hospit367-9029173623-DeltonCal0865Reliant EnerCibola General HospitalgyDrake Center For Post-Lenward33 CLazaru010West Florida Surgery Center Systems develoTresa Endo9eTurner 40981ni380-Mallie 63HDario GuardiaSunset Ridge Surgery Center 25Neospin([phone] PuBlue Bonnet 13251Shands Live Oak Regional Medical Centeryfield C46itiLa GranV4C97oHi907Molli Ogden R onCraige Cott281The Surgical Hospital Of Jonesbo220-37650844873-DeltonThe Medi0865Reliant EnerRady Children'S Hospital - San DiegogyNaval Health Clinic (JoLenward22 CLazaru010Mercy Medical CenSystems develoTresa Endo4eTurner 40981ni606-Mallie 56HDario GuardiaNew York Psychiatric Instit(57Novamed Eye Surgery Center Of OverlKentuckyand Life Care Hospitals Of DaytonKentucky4540130(Lamount 16109S ieCraige Cott716The Champion Cent(704) 87278381226-Delt0865Reliant EnerGs Campus Asc Dba Lafayette Surgery CentergyEye Surgical CenterLenward38 CLazaru010Baton Rouge General Medical Center (Mid-CiSystems develoTresa Endo6eTurner 40981ni856-Mallie 3HDario GuardiaKissimmee Endoscopy Cen54Orlando936 356 [phone] Psiquiatrico De931Surgery Center Of Amarillo0898817-80BuRayfield C108itiGall58iHi637Moll axCraige Cott(303)Actd LLC Dba Green Mountain Surgery Cent([phone](541)DeltonSali0865Reliant EnerAdvanced Ambulatory Surgery Center LPgyUt Health East TexasLenward66 CLazaru010Utah Valley Regional Medical CenSystems develoTresa Endo5eTurner 40981ni(959)Mallie 62HDario GuardiaBellevue Hospital Cen(72Childrens Hosp & KentuckyliniRimrock FoundationKentucky4540130(Lamount 16109South teCraige Cott628Surgical Institute Of Michig954-7352015502-DeltonBaytown Endoscopy Center LLC Dba Bayt0865Reliant EnerMazzocco Ambulatory Surgical CentergyUniversity Of WashingtonLenward3 CLazaru010Big South Fork Medical CenSystems develoTresa Endo5eTurner 40981ni816-Mallie 22H445-024440-664-742([phone] Associates Endoscopy Clinic64010787Laj(757) 41Adriana Simas21308531(639New Mexico Rehabilitation Centerield C10itiKennaV6Grand23 JHi3132Molli Miami unCraige Cott431Ocean County Eye Associates [phone] Par0865Reliant EnerSheltering Arms Rehabilitation HospitalgySouthwest MedicalLenward61 CLazaru010Adventist Healthcare Washington Adventist HospiSystems develoTresa Endo7eTurner 40981ni(587)Mallie 51HDario GuardiaCharleston Surgery Center L785 133 479763-169-1339mitHelena Surgicenter(248) [phone](831)41130822(865)301Moore Orthopaedic Clinic Outpatient Surgery Center LLC C48itiFranklinto69V2Hi(2700Molli Sutter Ama SCraige Cott563Kaiser Foundation Los Angeles Medical [phone] DeltonFolsom Outpatient Surgery Center LP Dba F0865Reliant EnerMethodist HospitalgyUhhs Memorial HosLenward53 CLazaru010Dallas Behavioral Healthcare Hospital Systems develoTresa Endo9eTurner 40981ni(330)Mallie 4HDario [phone] Co. ([phone])[phone] Hospital Of Pittsb951-466-43Laj337-41Adriana Simas244S301Good Shepherd Specialty HospitaluRayfield C28itiHambuV40CHi6281Molli Albert stCraige Cott(858Physicians Eye Surgery Center I(951) [phone] DeltonMountai0865Reliant EnerOgden Regional Medical CentergyBucyrus ComLenward56 CLazaru010Oakbend Medical Center Wharton CamSystems develoTresa Endo9eTurner 40981939-257-995(541) 235-9333i(6Ascension Macomb Oakland Hosp-W(424)Reno Behavioral Healthcare Hospital(732)40BuRayfield C75itiRock HaV1102He60HiMo WCraige Cott612Mulberry Ambulatory Surgical Center L304-7761172412-DeltonClarks S0865Reliant EnerNorth Alabama Regional HospitalgyAtriLenward71 CLazaru010Fallbrook Hospital DistrSystems develoTresa Endo4eTurner 40981ni(248) Mallie 66HDario [phone] GaNovamed Surgery Center Of Cleveland740-709-02Laj(98(463)592-959816-090-7061) 4Northwest Surgica(240)[facility]15)52BuRayfield C46itiState L76iVHi5993Molli GCraige Cott(859Avera Medical Group Worthington Surgetry Cent714-3342204586-De0865Reliant EnerWake Endoscopy Center LLCgyMarin Health Ventures LLC Dba Marin SpecialtyLenward87 CLazaru010Texas Health Surgery Center Fort Worth MidtSystems develoTresa Endo9eTurner 40981ni915 Mallie 51HDario GuardiaRogers Mem [phone] Carlisle End130864213-60573Marcus Daly Memorial Hospital47itiLake CiV5M57anHi(46107Molli Southern Ne exCraige Cott206Center For Advanced Surge303-5512264575-DeltonKind0865Reliant EnerAcuity Specialty Hospital Of Arizona At MesagyKaiser Fnd HosLenward60 CLazaru010Va N. Indiana Healthcare System - Ft. WaSystems develoTresa Endo4eTurner 40981ni(684) Mallie 73HDario GuardiaPractice Partners In Healthcare20North Mississip802751938774-830-3665i MSt. Mary'S Healthcare - Amsterdam Memoria308-332-8505758358318 CaFort L1308(657519-20BuRayfield C40iti309Bear Valley Community HospitaltHi633Molli Providence Little Company Of Mar ubCraige Cott5Harris Regional Hospit([phone](240)DeltonGreenbriar Reh0865Reliant EnerLowell General HospitalgySelect Specialty HospitLenward73 CL[address] Margaret Health - HammSystems develoTresa Endo6eTurner 4098249-343-640404-613-1422ni7Edward H705El Paso Ltac Hospital308307641 26BuRayfield C56itiSlaVMa49rtHi808 llCraige Cott201Lawrence Medical Cent718-785073848 DeltonParkland Healt0865Reliant EnerEncompass Health Rehabilitation Hospital Of Spring HillgyHigLenward24 CLazaru010Olean General HospiSystems develoTresa Endo3eTurner 40981ni(724) Mallie 58HDario GuardiaParis Surgery Center 51Lucas County HKentuckyealtThe Endoscopy Center Of Southeast Georgia IncKentucky4540130Lamount 16109D ArCraige Cott250Texas Health Presbyterian Hospital Dent716-53161115209-DeltonCarbon Schuylkill0865Reliant EnerPagosa Mountain HospitalgyBayfront Ambulatory SurgLenward71 CLazaru010Chi Lisbon HeaSystems develoTresa Endo7eTurner 40981ni62Mal336 167 639480-595-6902ie Fort [place] Beach Medical Ce97375795Laj4341Adriana [phone](Lowell General Hosp [phone] MSalina Regional Health Ce(440) 139Manatee Surgical Center LLCBuRayfield C16itiHopeweV328RoHi574Molli Bear veCraige Cott970Natchitoches Regional Medical [phone](754) DeltonSaint Josephs Hospita0865Reliant EnerLakeside Endoscopy Center LLCgyGateways Hospital And MentaLenward36 CLazaru010Crossroads Community HospiSystems developTresa Endo5eTurner 40981ni579-Mallie 44HDario GuardiaFlorida State Hospi(507Dupont KentuckyHospValley Hospital Medical CenterKentucky4540130Lamount 16109Moun Craige Cott763Eye Laser And Surgery Center Of Columbus L931-680317681DeltonAvail Health L0865Reliant EnerTrinity HospitalgyArkansas Surgery And EndosLenward71 CLazaru010Hocking Valley Community HospiSystems develoTresa Endo1eTurner 40981ni62Mallie 225120332909-478-16492HDArkansas Methodist Medical Ce68(325) [phone]sychiatric Ho1(617)Dallas Behavioral Healthcare Hospital LLCBuRayfield C8itiPort B73yr10HiMolli Marshall diCraige Cott818Eyehealth Eastside Surgery Center L479-54444385575-DeltonMills-Peni0865Reliant EnerChatham Orthopaedic Surgery Asc LLCgyCrow ValleyLenward38 CLazaru010Piedmont Mountainside HospiSystems develoTresa Endo3eTurner 40981ni631-Mallie 39HDario GuardiaIntegris Southwest Medical Cen8480 775 664608-306-9206EndParkwood Behavior13952Inov8 SurgicalBuRayfield C23itiPeach LaV456ToHi8490Molli S VCraige Cott3Heber Valley Medical Cent702-7798137(215) DeltonLow0865Reliant EnerChristus Good Shepherd Medical Center - MarshallgySt Josephs AreLenward7 CLazaru010Beltway Surgery Centers LLC Dba East Washington Surgery CenSystems develoTresa Endo2eTurner 40981ni(901)Mallie 64HDario GuardiaCentral State Hospital Psych307-238-499(782) 442-4153at232[address] Surgery Center(678)130860493629Jackson Parish Hospitalld C74itiStouts26viHi3198Molli United Medica<MEASUREME8476176-(818Cascade Valley Arli08LenwardSystems deveTresa E409MalliDarioFairvKentuckyiCenter For Surgical Excellence I161 a Craige Cott906Trihealth Surgery Center Anders472809542863-DeltonPresbyterian St L0865Reliant EnerMulberry Ambulatory Surgical Center LLCgyOklahoma State UniversityLenward24 CLazaru010Parkland Memorial HospiSystems develoTresa Endo3eTurner 40981ni213-Mallie 50HDario Guardi(240) [phone] Oaks Rehabilitation Hosp(952) 24916Eyecare Consultants Surgery Center LLCuRayfield C27itiValleciV2La39keHi6187Molli S JoCraige Cott641Grand Rapids Surgical Suites PL([phone](939) DeltonDoor C0865Reliant EnerFoster G Mcgaw Hospital Loyola University Medical CentergySierraLenward30 CLazaru010Overlake Hospital Medical CenSystems develoTresa Endo8eTurner 40981ni(570) Mallie 66HDario GuardiaBelmont Pines Hospi53Orthopaedic Surgery Center Of KentuckyAsheSt Thomas Medical Group Endoscopy Center LLKentucky4540130Lamount 16109 loCraige Cott(317Elite Surgical Center L82515293585-DeltonLovelace Regiona0865Reliant EnerSsm Health St. Louis University Hospital - South CampusgyWilshire EndosLenward58 CLazaru010Northeast Rehabilitation Hospital At PeSystems develoTresa Endo3eTurner 40981ni(940)Mallie 70HDario GuardiaEyes Of York Sur407-308-525(715)251-121587-319-6698074Nmmc W130832(9214Gi Diagnostic Center LLCeld C66itiPryor CreV7Clarks37onHi183Molli Fo MyCraige Cott314Brentwood Surgery Center L575-32921348848-DeltonNortheast Ohi0865Reliant EnerOrthopaedics Specialists Surgi Center LLCgyOtto Kaiser MeLenward67 CLazaru010Eastern Oklahoma Medical CenSystems develoTresa Endo9eTurner 40981ni715 Mallie 53HDario(762)[phone] Hosp(903)860-23Laj(928)4959Encompass Health Rehabilitation Hospital Of Plano08407 3170BuRayfield C67itiThornto6V2Hi6993M i Craige Cott682The Jerome Golden Center For Behavioral Heal73714771979-DeltonWe0865Reliant EnerStephens County HospitalgyNorton Women'S And Kosair ChilLenward49 CLazaru010Premiere Surgery Center Systems develTresa Endo4eTurner 40981ni352-Mallie 64HDario GuardiaSabetha Community Hospi50Hattiesburg Clinic [phone] 086 5888mbuRiver Valley Ambulatory Surgical Ce(270) 830-12Laj(440)41Adriana Simas244254217598780-481-4663aylBayside Center For Behavioral He260-851-60Laj(313) 41Adriana(928Warren General Hospital07BuRayfield C60iti14DeHi712Molli Odessa Reg alCraige Cott463Largo Medical Center - Indian Roc(774) 84184909712-DeltonTricit0865Reliant EnerJefferson County Health CentergyPrisma Health Lenward26 CLazaru010Osceola Regional Medical CenSystems develoTresa Endo7eTurner 40981ni216-Mallie 76HDario GuardiaCentury Hospital Medical Cen38Fort Lauderdale Behavioral HKentucky587767438443 266 3293altRefugio [phone] MeRehabilitation Hospital Of Jenn6([phone](920) [phone] Assessment Ce279 055 1437Va New York Harbor Healthcare System - Brooklyn5BuRayfield C53itiPom14oHi(26539Molli Coral w Craige Cott(403Beaufort Memorial Hospit901-45973594670-DeltonMercy0865Reliant EnerMagnolia HospitalgyArapahoe Lenward56 CLazaru010Abrazo West Campus Hospital Development Of West PhoeSystems develoTresa Endo0eTurner 40981ni931-Mallie 88HDario GuardiaSouthwest Health Center (35Moncrief Army CommunKentuckyity Premier Gastroenterology Associates Dba Premier Surgery CenterKentucky4540130Lamount 1610 enCraige Cott224Professional Hosp Inc - Mana702 494613(450)DeltonArizona Sp0865Reliant EnerPresence Chicago Hospitals Network Dba Presence Saint Francis HospitalgyAbrom Kaplan MeLenward72 CLazaru010Fayetteville Asc Systems develoTresa Endo7eTurner 40981ni564-M782 772 790801-169-7925lliWest Anaheim Medical Ce817 532 305254-787-81631520-290-946816-653-75530BeSalem304St. Vincent'S St.Clair041BuRayfield C90itiWallingtV3F36orHi(3591Mo Craige Cott(352Bayfront Health Seven Rive719-4069293681 DeltonNortheastern Vermo0865Reliant EnerPort Jefferson Surgery CentergyLake Region Lenward35 CLazaru010Community Memorial HospiSystems develoTresa Endo1eTurner 40981ni332-Mallie 25HDario GuardiaBeatrice Community Hospi(86The Hospitals Of Providence TransmouKentuckyntaiNorth Coast Surgery Center LtdKentucky4540130Lamount 16109M elCraige Cott609Vip Surg Asc L754-156242445-DeltonCheyenne0865Reliant EnerMid-Jefferson Extended Care HospitalgyAuburn SurLenward47 CLazaru010Peacehealth Peace Island Medical CenSystems develoTresa Endo2eTurner 40981ni670-Mallie 70HDario GuardiaNorthw805 123 781785-270-9627st Integris Bass Baptist Health Ce575 637 52Laj(3130(657Clovis Community Medical CenteruRayfield C71itiBuV794DHi(2222Molli Central riCraige Cott986Group Health Eastside Hospit782 41601306(626) DeltonSurgical Institute0865Reliant EnerNiobrara Health And Life CentergyElkview GLenward31 CLazaru010Kindred Hospital SprSystems develTresa Endo3eTurner 40981ni785 Mallie 49HDario GuardiaArbor Health Morton General Hospi85Tidelands Health([phone] 234 24936177612South Coast Global Medical Center675BuRayfield C71itiThorn28tVHi(5215Molli Si a Craige Cott646Annie Penn Hospit437-610273(873)DeltonCur0865Reliant EnerSpringhill Medical CentergyAmarillo ELenward76 CLazaru010United Regional Medical CenSystems develoTresa Endo2eTurner 40981ni437 Mallie 72HDario GuardiaSpartan419 017 458(319404415036832 462 6601651University General Hospital Da(641) 661-81Laj(367)41130431Ancora Psychiatric HospitalRayfield C42itiOliver Spri38nHi(700Molli Cen caCraige Cott737Taylor Regional Hospit(570) 8752729(865)DeltonBanner D0865Reliant EnerIberia Rehabilitation HospitalgySurgery CLenward110 CLazaru[address] Specialty Surgery Center Systems develoTresa Endo3eTurner 40981ni469-Mallie 8HDario GuardiaSaint Joseph'S Regional Medical Center - Plymo(87Virginia Mason MeKentuckydicaTacoma General HospitalKentucky4540130Lamount16109Mo llCraige Cott909Medical City Of Lewisvil(786) 43021192678-DeltonGrove Hi0865Reliant EnerSouthern Lakes Endoscopy CentergyNorth Mississippi Ambulatory SurLenward13 CLazaru010Surgical Institute Of ReadSystems develoTresa Endo6eTurner 40981ni(270)Mallie 18HDario GuardiaHouston Methodist San Jacint458-6641 778 449678-288-3964-01Forrest General Hosp929 721 22Laj(830)41Adriana Simas244Memor(971) [phone] Colfax Medical Ce802130831((785North  Baptist Hospitalfield C7itiHo89uVHi(41703Molli Carlin Vision<MEASUREME(9376043)859-DeltonSharp0865Reliant EnerAdvanced Surgery Center Of Orlando LLenward73 CLazaru010West Central Georgia Regional HospiSystems deveTresa E4098Mallie HDario [phone] Saint Joseph Medical Ce(8340Endoscopy Center Of LodiBuRayfield C57it48iKHi(681Molli Hocking Vall CoCraige Cott509Lenox Hill Hospit(438) [phone](661) Delt0865Reliant EnerSutter Medical Center Of Santa RosagyRobert Wood Johnson University HoLenward101 CLazaru010Memorial Hermann Surgery Center Brazoria Systems develoTresa Endo5eTurner 40981ni541-Mallie 33HDario GuardiaMcleod Seaco58Black R857-337-036([phone] Of M D Upper Chesapeake Medi(403)Fort Loudoun Medical Center5072BuRayfield C29itiTry72VRHi4658Molli Meth stCraige Cott505Waco Gastroenterology Endoscopy Cent980 1770184425-DeltonSpoka0865Reliant EnerFranklin General HospitalgyAce Endoscopy AndLenward8 CLazaru010Three Rivers Behavioral HeaSystems develoTresa Endo2eTurner 40981ni(562)Mallie 26HDario GuardiaOrange919-428-115708-852-3186ParMayo Clinic Health Sys386-61308407(720)76629Baylor Scott & White Hospital - Taylor56itiWillow CiV7Airwa70yHi680Molli Silver Sp g Craige Cott5Intracare North Hospit(224)777778147DeltonNorth Idaho Ca0865Reliant EnerAlta Bates Summit Med Ctr-Alta Bates CampusgyDigestive Disease Center Of CentrLenward19 CLazaru010St Joseph Mercy ChelSystems develoTresa Endo1eTurner 40981ni662-Mallie 63HDario GuardiaPampa Regional Medical Cen21Kindred Hospital - New Jersey - MKentuckyorriKirby Forensic Psychiatric CenterKentucky4540130Lamount 16109Ocea ieCraige Cott640Mei Surgery Center PLLC Dba Michigan Eye Surgery Cent(339) [phone](443)DeltonRehabilitation Hospital Of Fo0865Reliant EnerWellspan Ephrata Community HospitalgySouthwest Minnesota SurgLenward41 CLazaru010University General Hospital DalSystems develoTresa Endo5eTurner 40981ni857-Mallie 42HDario GuardiaOsage Beach Center For Cognitive Disor(539Barnw(304)738-672769-265-1085ll Nevada Regional Medical Ce(437) 053-65Laj214-130881816-82BuRa848Memorial HospitaliLincoln PaV7Sp52riHi566Molli Cardiovascular rgCraige Cott6Hernando Endoscopy And Surgery Cent941-57760103563-DeltonPioneer Ambulator0865Reliant EnerFoothill Presbyterian Hospital-Johnston MemorialgyMid Florida Endoscopy And SurLenward74 CLazaru010Midlands Orthopaedics Surgery CenSystems develoTresa Endo5eTurner 40981ni248-Mallie 54HDario GuardiaSanford Med C9706666311ief Rvr F(58Southeast Eye SurgerKentuckySaTristate geCraige Cott971Coastal Surgery Center L620-61499236(469)DeltonGreenvill0865Reliant EnerStoughton HospitalgyUniversity Of South AlabamaLenward12 CLazaru010Columbus Endoscopy Center Systems develoTresa Endo1eTurner 40981ni(831)Mallie 62HDario GuardiaUniversity Of Illinois Hospi68805-416-350(586)441-53368-5Shriners [facility] For Chil(780)301308517(608) 44BuRayfi6Encompass Health Rehabilitation Institute Of Tucsonnomonee FalV223CaHi1087Molli Christiana Care riCraige Cott505Select Specialty Hospital Johnsto647-81771644220-DeltonSakakawea 0865Reliant EnerEndoscopy Center Of El PasogyGeisinger -LewLenward94 CLazaru010Rmc JacksonviSystems develoTresa Endo4eTurner 40981ni319-Mallie 2HDario GuardiaAurora Endoscopy Center East Bay Division - Martinez OutpaKentuckytienSurgery Center623-582-112520-049-6853Of Lee Regional Medical Ce(502)[phone] 02BuRa850Herington Municipal HospitaliRich Cre53V7Hi3845Molli Eye Associates Nort<MEASUREME(302)49Lenwar409MallLa Peer Surge161 a Craige Cott601Coney Island Hospit516-67366693478-Delto0865Reliant EnerSsm Health Surgerydigestive Health Ctr On Park StgySurgicare Of CenLenward40 CLazaru010Lake Ambulatory Surgery Systems develoTresa Endo9eTurner 40981ni463-Mallie 36HDario GuardiaWhittier Rehabilitation Hospi(64Oro ValKentuckyley Grace Hospital South PointeKentucky4540130Lamount 16109Smiths S ioCraige Cott(321Brooke Glen Behavioral Hospit([phone](947)DeltonPhs Indian Hospital Cr0865Reliant EnerHiawatha Community HospitalgySonora Lenward19 CLazaru010La Porte HospiSystems develoTresa Endo3eTurner 40981ni930-Mallie 24HDario GuardiaSaint Thomas Campus SurgicareSt Joseph'S HoKentuckyspitMercy HospitalKentucky4540130(Lamount 16109 loCraige Cott513Va Maryland Healthcare System - Perry Poi(530) [phone](971)DeltonLandmark Hos0865Reliant EnerThe Ridge Behavioral Health SystemgyBaptist Memorial HosLenward1 CLazaru010Newport Beach Center For Surgery Systems develoTresa Endo6eTurner 40981ni917-Mallie 62HDario GuardiaCastle Hills Surgicare 343-578-88820-209-51860AsMidwest Digestive Health Ce130877(80281 846 798(334)546-4589708-848-488325-677-830463C130824204-05BuRayfiel8[address] Specialty Surgery Center LLCen BeaV5Ci12rcHi1032Molli Mid Hudson Forensi syCraige Cott470Umass Memorial Medical Center - Memorial Camp269-55968563660-DeltonAscension Se Wisconsi0865Reliant EnerNorth Tampa Behavioral HealthgyUltimate HealLenward61 CLazaru010Hunterdon Center For Surgery Systems develoTresa Endo4eTurner 40981ni(873)Mallie 89HDario GuardiaBenchmark R(309)005-697801-127-4669giMason City Ambulatory Surgery 514Kaiser Permanente Panorama CityBuRayfield C45itiGrayson Vall48V531HiMolli L CCraige Cott(412)Ochsner Extended Care Hospital Of Kenn234-53925229-DeltonNava0865Reliant EnerAdams [facility] CentergyBoys Town National ReLenward53 CLazaru010Continuing Care HospiSystems develoTresa Endo8eTurner [phone](854)Mallie 34HDario GuardiaThe Corpus [phone] 705 2847risChillic343-554-3235514168977theIngalls Memorial Ho(313)Jerold PheLPs [facility]1914-41BuRayfield C18itiJesV5Charle54stHi62M i Craige Cott(820[phone]-DeltonNew York Presbyterian Hospital - Columbia0865Reliant EnerPaulding County HospitalgyHighlands-CaLenward14 CLazaru010[facility] HospiSystems develoTresa Endo5eTurner [phone](212)Mallie 69HDario GuardiaOceans Behavioral Hospital Of Alexand68[place] MeKentuckydicaScottsdale Healthcare Thompson PeakKentucky4540130Lamount 16109Qui gaCraige Cott817[facility]985 [phone]-DeltonCypress Outpatient0865Reliant EnerAlliance Health SystemgyElmhurst MeLenward71 CLazaru010[facility] HospiSystems develoTresa Endo2eTurner [phone] - TricitiesGuardi7187BuRayfield C50itiMountain HouV2Fet s Craige Cott252[facility]([phone]-DeltonThe 0865Reliant EnerWorcester Recovery Center And HospitalgyJohnson CityLenward60 CLazaru010[facility] HospiSystems develTresa Endo4eTurner 40981ni785-Mallie 43HDario GuardiaLutheran Hospital Of [phone] Childrens Rehabi1308(478260[facility] Joint DiseasesRayfield C97itiPinehiV329KHi(4133Molli Humbo GCraige Cott(616[facility] Mc - [phone]-DeltonWickenbur0865Reliant EnerCharles George Va Medical CentergySelect Specialty Hospital-NortLenward33 CLazaru010[facility] CenSystems develoTresa Endo7eTurner 40981ni704-Mallie 13HDario GuardiaOceans Behavioral Hospital Of Luf86WestfieKentuckylds Peoria Ambulatory SurgeryKentucky4540130Lamount16109Ashlandommentari8araicaz29G51 ght red in appearance with small clots noted. Page to Ortho MD made to inform of urine output findings.

## 2017-02-05 NOTE — H&P (Signed)
History and Physical    Mark Hansen WUJ:[id number] DOB: [birth year] DOA: 02/05/2017  PCP: Melida Gimenez, MD  Patient coming from: home  I have personally briefly reviewed patient's old medical records in [facility]  Chief Complaint: hip fracture  HPI: Mark Hansen is Mark Hansen 71 y.o. male with chart history of GERD, PVD, BPH presenting after fall with hip fracture.   The patient was standing on Mark Hansen barstool to stop the smoke detector from beeping and dropped something. When he dropped something, the stool slipped out from 90 underneath him and hit him in the left hip. He fell to the ground. He did not hit unrevealing other than his left hip. He denies any head trauma. From there he crawled to his bed. He was able to stand and walk with Mark Hansen limp after that, but after going to bed at Mark Hansen very difficult time getting up to use the restroom which was per the reason that he came into the emergency room.  He only takes low-dose aspirin and vitamin E. He denies Mark Hansen history of high blood pressure diabetes. He jogs weekly. He lives at home with his daughter.  ED Course: In the ED he had plain films followed by an MRI which showed Safwan Tomei acute subcapital left hip fracture extending into the intertrochanteric left femur with prostatomegaly as well.  Review of Systems: As per HPI otherwise 10 point review of systems negative.   Past Medical History:  Diagnosis Date  . Anxiety   . Back pain   . GERD (gastroesophageal reflux disease)   . Peripheral vascular disease (HCC)   . Prostate disease     Past Surgical History:  Procedure Laterality Date  . ANKLE SURGERY     bilateral ankle surgery  . OTHER SURGICAL HISTORY     unspecified type of prostate surgery     reports that he has quit smoking. His smoking use included Cigarettes. He has Mark Hansen 35.00 pack-year smoking history. He has never used smokeless tobacco. He reports that he does not drink alcohol or use drugs.  No Known Allergies  Family History    Problem Relation Age of Onset  . CAD Brother 50       requiring bypass  . CAD Mother 79  . Asthma Father   . Kidney disease Unknown        cousin   Prior to Admission medications   Medication Sig Start Date End Date Taking? Authorizing Provider  aspirin EC 81 MG tablet Take 81 mg by mouth daily.   Yes [provider]  ibuprofen (ADVIL,MOTRIN) 200 MG tablet Take 800 mg by mouth every 6 (six) hours as needed for moderate pain.   Yes [provider]  vitamin E 1000 UNIT capsule Take 1,000 Units by mouth daily.   Yes [provider]    Physical Exam: Vitals:   02/05/17 1130 02/05/17 1200 02/05/17 1628 02/05/17 1700  BP: 121/75 109/65 118/66 (!) 151/86  Hansen: 76 71 74 80  Resp: Temp:      TempSrc:      SpO2: 91% 94% 98% 96%    Constitutional: NAD, calm, comfortable Vitals:   02/05/17 1130 02/05/17 1200 02/05/17 1628 02/05/17 1700  BP: 121/75 109/65 118/66 (!) 151/86  Hansen: 76 71 74 80  Resp: Temp:      TempSrc:      SpO2: 91% 94% 98% 96%   NAD Eyes: PERRL, lids and conjunctivae normal  ENMT: Mucous membranes are moist. Posterior pharynx clear of any exudate or lesions.Normal dentition.  Neck: normal, supple, no masses, no thyromegaly Respiratory: clear to auscultation bilaterally, no wheezing, no crackles. Normal respiratory effort. No accessory muscle use.  Cardiovascular: Regular rate and rhythm, no murmurs / rubs / gallops. No extremity edema. 2+ pedal pulses. No carotid bruits.  Abdomen: no tenderness, no masses palpated. No hepatosplenomegaly. Bowel sounds positive.  Musculoskeletal: no clubbing / cyanosis. L hip tender to palpation.   Normal muscle tone.  Skin: no rashes, lesions, ulcers. No induration Neurologic: CN 2-12 grossly intact. Sensation intact. Strength 5/5 in all 4. Equal dorsiflexion/plantarflexion bilaterally.  Psychiatric: Normal judgment and insight. Alert and oriented x 3. Normal mood.    Labs  on Admission: I have personally reviewed following labs and imaging studies  CBC:  Recent Labs Lab 02/05/17 1519  WBC 7.0  HGB 15.8  HCT 46.3  MCV 85.9  PLT 138*   Basic Metabolic Panel:  Recent Labs Lab 02/05/17 1519  NA 140  K 3.9  CL 103  CO2 30  GLUCOSE 101*  BUN 17  CREATININE 0.87  CALCIUM 8.7*   GFR: Estimated Creatinine Clearance: 63 mL/min (by C-G formula based on SCr of 0.87 mg/dL). Liver Function Tests:  Recent Labs Lab 02/05/17 1519  AST 22  ALT 19  ALKPHOS 65  BILITOT 2.5*  PROT 6.4*  ALBUMIN 4.2   No results for input(s): LIPASE, AMYLASE in the last 168 hours. No results for input(s): AMMONIA in the last 168 hours. Coagulation Profile:  Recent Labs Lab 02/05/17 1519  INR 0.99   Cardiac Enzymes: No results for input(s): CKTOTAL, CKMB, CKMBINDEX, TROPONINI in the last 168 hours. BNP (last 3 results) No results for input(s): PROBNP in the last 8760 hours. HbA1C: No results for input(s): HGBA1C in the last 72 hours. CBG: No results for input(s): GLUCAP in the last 168 hours. Lipid Profile: No results for input(s): CHOL, HDL, LDLCALC, TRIG, CHOLHDL, LDLDIRECT in the last 72 hours. Thyroid Function Tests: No results for input(s): TSH, T4TOTAL, FREET4, T3FREE, THYROIDAB in the last 72 hours. Anemia Panel: No results for input(s): VITAMINB12, FOLATE, FERRITIN, TIBC, IRON, RETICCTPCT in the last 72 hours. Urine analysis: No results found for: COLORURINE, APPEARANCEUR, LABSPEC, PHURINE, GLUCOSEU, HGBUR, BILIRUBINUR, KETONESUR, PROTEINUR, UROBILINOGEN, NITRITE, LEUKOCYTESUR  Radiological Exams on Admission: Mr Hip Left Wo Contrast  Result Date: 02/05/2017 CLINICAL DATA:  Left hip pain since Mark Hansen fall off Kya Mayfield stool this morning. Initial encounter. EXAM: MR OF THE LEFT HIP WITHOUT CONTRAST TECHNIQUE: Multiplanar, multisequence MR imaging was performed. No intravenous contrast was administered. COMPARISON:  Plain films left hip 02/05/2017 FINDINGS:  Bones: The patient has an acute subcapital left hip fracture. The fracture extends into the intertrochanteric left femur. Associated marrow edema is identified. Bone marrow signal is otherwise normal. Articular cartilage and labrum Articular cartilage:  Preserved. Labrum:  Intact. Joint or bursal effusion Joint effusion:  Small left hip effusion is noted. Bursae:  Normal. Muscles and tendons Muscles and tendons:  Intact. Other findings Miscellaneous: Imaged intrapelvic contents demonstrate mild prostatomegaly. IMPRESSION: Acute subcapital left hip fracture extends into the intertrochanteric left femur. No other acute abnormality. Mild prostatomegaly. Electronically Signed   By: Drusilla Kanner M.D.   On: 02/05/2017 13:03   Dg Chest Port 1 View  Result Date: 02/05/2017 CLINICAL DATA:  Preoperative examination. EXAM: PORTABLE CHEST 1 VIEW COMPARISON:  Chest x-ray dated June 19, 2012. FINDINGS: The cardiomediastinal silhouette is normal in size. Normal pulmonary  vascularity. Hyperinflated lungs with emphysematous changes. No focal consolidation, pleural effusion, or pneumothorax. No acute osseous abnormality. IMPRESSION: COPD.  No active cardiopulmonary disease. Electronically Signed   By: Obie Dredge M.D.   On: 02/05/2017 15:49   Dg Hip Unilat With Pelvis 2-3 Views Left  Result Date: 02/05/2017 CLINICAL DATA:  Larey Seat from stool and landed on left side. Complains of left hip pain. EXAM: DG HIP (WITH OR WITHOUT PELVIS) 2-3V LEFT COMPARISON:  None. FINDINGS: Pelvic bony ring is intact. Left hip is located without Roxy Mastandrea fracture. Normal appearance of the SI joints. No significant joint space narrowing in either hip. IMPRESSION: No acute bone abnormality to the pelvis or left hip. If there is high clinical suspicion for an occult left hip fracture, recommend further characterization with CT or MRI. Electronically Signed   By: Richarda Overlie M.D.   On: 02/05/2017 09:14    EKG: Independently reviewed.  pending  Assessment/Plan Active Problems:   Hip fracture (HCC)   Elevated bilirubin   Thrombocytopenia (HCC)  Subcapital left hip fracture which extends into the intertrochanteric left femur. Plan for OR this evening with ortho Revised cardiac risk index likely 0, but EKG pending.  Greater than 4 mets at baseline. Will need PTOT postop Pain control, APAP/oxy Postop DVT ppx needed NPO  Findings consistent with COPD on chest x-ray History of smoking, but has quit. No respiratory symptoms.  Elevated bilirubin:  2.5.  Normal alkaline phosphatase.  Unknown chronicity, not obviously icteric on exam. Follow-up tomorrow.  Thrombocytopenia: daily CBC, mild  Holding home ASA and vitamin E  DVT prophylaxis: holding for OR, resume postop Code Status: full  Family Communication: daughter, son in room  Disposition Plan: pending, potentially SNF vs rehab  Consults called: orthopedics  Admission status: inpatient    Lacretia Nicks MD [facility] [phone]   If 7PM-7AM, please contact night-coverage [email or web address] Password [email or web address]  02/05/2017, 5:46 PM

## 2017-02-05 NOTE — ED Provider Notes (Signed)
[facility]-EMERGENCY DEPT Provider Note   CSN: [id number] Arrival date & time: 02/05/17  4098     History   Chief Complaint Chief Complaint  Patient presents with  . Fall  . Hip Pain    left side    HPI Shishir Krantz is a 71 y.o. male.  He presents for evaluation of left hip pain which occurred when he fell from a stool, while changing a battery in a smoke detector this morning.  He was able ambulate somewhat but has pain in the left hip, when he stands.  Presents by EMS, and has been treated with narcotic analgesia, with some improvement.  He denies other injuries including head neck back and his other extremities.  He is with his daughter.  There are no other known modifying factors.  HPI  Past Medical History:  Diagnosis Date  . Anxiety   . Back pain   . GERD (gastroesophageal reflux disease)   . Peripheral vascular disease (HCC)   . Prostate disease     Patient Active Problem List   Diagnosis Date Noted  . Chest pain 06/19/2012  . Anxiety   . GERD (gastroesophageal reflux disease)   . Peripheral vascular disease (HCC)   . Prostate disease     Past Surgical History:  Procedure Laterality Date  . ANKLE SURGERY     bilateral ankle surgery  . OTHER SURGICAL HISTORY     unspecified type of prostate surgery       Home Medications    Prior to Admission medications   Medication Sig Start Date End Date Taking? Authorizing Provider  aspirin EC 81 MG tablet Take 81 mg by mouth daily.   Yes [provider]  ibuprofen (ADVIL,MOTRIN) 200 MG tablet Take 800 mg by mouth every 6 (six) hours as needed for moderate pain.   Yes [provider]  vitamin E 1000 UNIT capsule Take 1,000 Units by mouth daily.   Yes [provider]  acetaminophen-codeine (TYLENOL #3) 300-30 MG per tablet Take 1-2 tablets by mouth every 6 (six) hours as needed for moderate pain. Patient not taking: Reported on 02/05/2017 05/28/13   Quita Skye, MD  celecoxib (CELEBREX)  200 MG capsule Take 1 capsule (200 mg total) by mouth daily. Patient not taking: Reported on 02/05/2017 04/30/13   Elpidio Anis, PA-C  cephALEXin (KEFLEX) 500 MG capsule Take 1 capsule (500 mg total) by mouth 3 (three) times daily. Patient not taking: Reported on 02/05/2017 05/28/13   Quita Skye, MD    Family History Family History  Problem Relation Age of Onset  . CAD Brother 50       requiring bypass  . CAD Mother 47  . Asthma Father   . Kidney disease Unknown        cousin    Social History Social History  Substance Use Topics  . Smoking status: Former Smoker    Packs/day: 1.00    Years: 35.00    Types: Cigarettes  . Smokeless tobacco: Never Used  . 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     Allergies   Patient has no known allergies.   Review of Systems Review of Systems  All other systems reviewed and are negative.    Physical Exam Updated Vital Signs BP 109/65   Pulse 71   Temp 97.8 F (36.6 C) (Oral)   Resp 13   SpO2 94%   Physical Exam  Constitutional:  He is oriented to person, place, and time. He appears well-developed. No distress.  Elderly, robust  HENT:  Head: Normocephalic and atraumatic.  Right Ear: External ear normal.  Left Ear: External ear normal.  Eyes: Pupils are equal, round, and reactive to light. Conjunctivae and EOM are normal.  Neck: Normal range of motion and phonation normal. Neck supple.  Cardiovascular: Normal rate, regular rhythm and normal heart sounds.   Pulmonary/Chest: Effort normal and breath sounds normal. He exhibits no bony tenderness.  No rib tenderness, deformity or crepitation.  Abdominal: Soft. There is no tenderness.  Musculoskeletal: He exhibits no deformity.  Decreased movement actively left hip secondary to pain.  No pain on internal and external rotation of the left hip.  No shortening of the left leg.  Neurological: He is alert and oriented to person, place, and  time. No cranial nerve deficit or sensory deficit. He exhibits normal muscle tone. Coordination normal.  Skin: Skin is warm, dry and intact.  Psychiatric: He has a normal mood and affect. His behavior is normal. Judgment and thought content normal.  Nursing note and vitals reviewed.    ED Treatments / Results  Labs (all labs ordered are listed, but only abnormal results are displayed) Labs Reviewed  CBC  COMPREHENSIVE METABOLIC PANEL  PROTIME-INR    EKG  EKG Interpretation None       Radiology Mr Hip Left Wo Contrast  Result Date: 02/05/2017 CLINICAL DATA:  Left hip pain since a fall off a stool this morning. Initial encounter. EXAM: MR OF THE LEFT HIP WITHOUT CONTRAST TECHNIQUE: Multiplanar, multisequence MR imaging was performed. No intravenous contrast was administered. COMPARISON:  Plain films left hip 02/05/2017 FINDINGS: Bones: The patient has an acute subcapital left hip fracture. The fracture extends into the intertrochanteric left femur. Associated marrow edema is identified. Bone marrow signal is otherwise normal. Articular cartilage and labrum Articular cartilage:  Preserved. Labrum:  Intact. Joint or bursal effusion Joint effusion:  Small left hip effusion is noted. Bursae:  Normal. Muscles and tendons Muscles and tendons:  Intact. Other findings Miscellaneous: Imaged intrapelvic contents demonstrate mild prostatomegaly. IMPRESSION: Acute subcapital left hip fracture extends into the intertrochanteric left femur. No other acute abnormality. Mild prostatomegaly. Electronically Signed   By: Drusilla Kanner M.D.   On: 02/05/2017 13:03   Dg Hip Unilat With Pelvis 2-3 Views Left  Result Date: 02/05/2017 CLINICAL DATA:  Larey Seat from stool and landed on left side. Complains of left hip pain. EXAM: DG HIP (WITH OR WITHOUT PELVIS) 2-3V LEFT COMPARISON:  None. FINDINGS: Pelvic bony ring is intact. Left hip is located without a fracture. Normal appearance of the SI joints. No  significant joint space narrowing in either hip. IMPRESSION: No acute bone abnormality to the pelvis or left hip. If there is high clinical suspicion for an occult left hip fracture, recommend further characterization with CT or MRI. Electronically Signed   By: Richarda Overlie M.D.   On: 02/05/2017 09:14    Procedures Procedures (including critical care time)  Medications Ordered in ED Medications  fentaNYL (SUBLIMAZE) injection 100 mcg (100 mcg Intravenous Given 02/05/17 1101)  0.9 %  sodium chloride infusion ( Intravenous New Bag/Given 02/05/17 1059)  ondansetron (ZOFRAN) injection 4 mg (4 mg Intravenous Given 02/05/17 1100)     Initial Impression / Assessment and Plan / ED Course  I have reviewed the triage vital signs and the nursing notes.  Pertinent labs & imaging results that were available during my care of the  patient were reviewed by me and considered in my medical decision making (see chart for details).  Clinical Course as of Feb 06 1444  Fri Feb 05, 2017  1030 No evident fracture.  Patient clinically with fracture therefore will evaluate with advanced imaging technique, MRI. DG Hip Unilat With Pelvis 2-3 Views Left [EW]  1331 Subcapital fracture, extending as intertrochanteric fracture.  This will require operative management. MR HIP LEFT WO CONTRAST [EW]    Clinical Course User Index [EW] Mancel Bale, MD     Patient Vitals for the past 24 hrs:  BP Temp Temp src Pulse Resp SpO2  02/05/17 1200 109/65 - - 71 13 94 %  02/05/17 1130 121/75 - - 76 10 91 %  02/05/17 1100 122/70 - - 77 20 99 %  02/05/17 1030 103/61 - - 76 11 96 %  02/05/17 0828 - - - - - 98 %  02/05/17 0827 111/69 97.8 F (36.6 C) Oral 90 18 100 %    14: 30-case discussed with orthopedics, who will evaluate the patient and possibly perform surgical repair today.  He asked that the hospitalist admit the patient.  14: 40-hospitalist contacted me and will admit the patient.  2:44 PM Reevaluation with  update and discussion. After initial assessment and treatment, an updated evaluation reveals patient remains comfortable, alert and is still n.p.o.  Patient and family member updated on findings and plan.Mancel Bale L    Final Clinical Impressions(s) / ED Diagnoses   Final diagnoses:  Closed fracture of left hip, initial encounter (HCC)    Mechanical fall with isolated injury left hip fracture.  This will require surgical management.  Nursing Notes Reviewed/ Care Coordinated Applicable Imaging Reviewed Interpretation of Laboratory Data incorporated into ED treatment   Plan: Admit  New Prescriptions New Prescriptions   No medications on file     Mancel Bale, MD 02/05/17 1445

## 2017-02-05 NOTE — ED Notes (Addendum)
TRANSFER TO OR FOR SURGERY FOR LEFT HIP. 6 EAST ([facility]ARA) NOTIFIED.

## 2017-02-05 NOTE — ED Notes (Signed)
Bed: ZO10 Expected date:  Expected time:  Means of arrival:  Comments: 71 yo fall; hip pain

## 2017-02-05 NOTE — ED Notes (Signed)
ADMITTING Provider at bedside. 

## 2017-02-05 NOTE — ED Notes (Signed)
PT ATTEMPTING TO USE URINAL AT PRESENT

## 2017-02-05 NOTE — ED Notes (Signed)
ED Provider at bedside. [name] PRESENT

## 2017-02-05 NOTE — ED Notes (Signed)
ED Provider at bedside. 

## 2017-02-05 NOTE — Consult Note (Signed)
ORTHOPAEDIC CONSULTATION  REQUESTING PHYSICIAN: Zigmund Daniel., *  PCP:  Abelina Bachelor, MD  Chief Complaint: left hip pain.  HPI: Mark Hansen is a 71 y.o. male who complains of  Left hip pain.  He was on a stool earlier today at his house when he fell off and landed on his left hip. He had pain at that time that increased with weightbearing. He presented to the emergency department here [facility]. On diagnostic radiographs he was found to have normal studies however due to the persistent pain they rightfully ordered an MRI. That MRI did demonstrate a femoral neck fracture with marked edema down into the intertrochanteric region of the left hip. No other findings. I was consultative for orthopedic management of this condition.  Patient is a very pleasant gentleman who is currently retired and likes to stay very active around the house. He denies smoking. He is independent with all ADLs prior to this event. He does live independently.  Past Medical History:  Diagnosis Date  . Anxiety   . Back pain   . GERD (gastroesophageal reflux disease)   . Peripheral vascular disease (HCC)   . Prostate disease    Past Surgical History:  Procedure Laterality Date  . ANKLE SURGERY     bilateral ankle surgery  . OTHER SURGICAL HISTORY     unspecified type of prostate surgery   Social History   Social History  . Marital status: Married    Spouse name: N/A  . Number of children: N/A  . Years of education: N/A   Occupational History  . Disabled     secondary to accident at work several years ago.   Social History Main Topics  . Smoking status: Former Smoker    Packs/day: 1.00    Years: 35.00    Types: Cigarettes  . Smokeless tobacco: Never Used  . 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  . Drug use: No  . Sexual activity: Not Asked   Other Topics Concern  . None   Social History Narrative   Lives in [place] with  his wife. Formerly worked in Editor, commissioning. Emigrated from [place] when he was 71 years old.    Family History  Problem Relation Age of Onset  . CAD Brother 50       requiring bypass  . CAD Mother 80  . Asthma Father   . Kidney disease Unknown        cousin   No Known Allergies Prior to Admission medications   Medication Sig Start Date End Date Taking? Authorizing Provider  aspirin EC 81 MG tablet Take 81 mg by mouth daily.   Yes [provider]  ibuprofen (ADVIL,MOTRIN) 200 MG tablet Take 800 mg by mouth every 6 (six) hours as needed for moderate pain.   Yes [provider]  vitamin E 1000 UNIT capsule Take 1,000 Units by mouth daily.   Yes [provider]   Mr Hip Left Wo Contrast  Result Date: 02/05/2017 CLINICAL DATA:  Left hip pain since a fall off a stool this morning. Initial encounter. EXAM: MR OF THE LEFT HIP WITHOUT CONTRAST TECHNIQUE: Multiplanar, multisequence MR imaging was performed. No intravenous contrast was administered. COMPARISON:  Plain films left hip 02/05/2017 FINDINGS: Bones: The patient has an acute subcapital left hip fracture. The fracture extends into the intertrochanteric left femur. Associated marrow edema is identified. Bone marrow signal is  otherwise normal. Articular cartilage and labrum Articular cartilage:  Preserved. Labrum:  Intact. Joint or bursal effusion Joint effusion:  Small left hip effusion is noted. Bursae:  Normal. Muscles and tendons Muscles and tendons:  Intact. Other findings Miscellaneous: Imaged intrapelvic contents demonstrate mild prostatomegaly. IMPRESSION: Acute subcapital left hip fracture extends into the intertrochanteric left femur. No other acute abnormality. Mild prostatomegaly. Electronically Signed   By: Drusilla Kanner M.D.   On: 02/05/2017 13:03   Dg Chest Port 1 View  Result Date: 02/05/2017 CLINICAL DATA:  Preoperative examination. EXAM: PORTABLE CHEST 1 VIEW COMPARISON:  Chest x-ray dated June 19, 2012. FINDINGS: The cardiomediastinal silhouette is normal in size. Normal pulmonary vascularity. Hyperinflated lungs with emphysematous changes. No focal consolidation, pleural effusion, or pneumothorax. No acute osseous abnormality. IMPRESSION: COPD.  No active cardiopulmonary disease. Electronically Signed   By: Obie Dredge M.D.   On: 02/05/2017 15:49   Dg Hip Unilat With Pelvis 2-3 Views Left  Result Date: 02/05/2017 CLINICAL DATA:  Larey Seat from stool and landed on left side. Complains of left hip pain. EXAM: DG HIP (WITH OR WITHOUT PELVIS) 2-3V LEFT COMPARISON:  None. FINDINGS: Pelvic bony ring is intact. Left hip is located without a fracture. Normal appearance of the SI joints. No significant joint space narrowing in either hip. IMPRESSION: No acute bone abnormality to the pelvis or left hip. If there is high clinical suspicion for an occult left hip fracture, recommend further characterization with CT or MRI. Electronically Signed   By: Richarda Overlie M.D.   On: 02/05/2017 09:14    Positive ROS: All other systems have been reviewed and were otherwise negative with the exception of those mentioned in the HPI and as above.  Physical Exam: General: Alert, no acute distress Cardiovascular: No pedal edema Respiratory: No cyanosis, no use of accessory musculature GI: No organomegaly, abdomen is soft and non-tender Skin: No lesions in the area of chief complaint Neurologic: Sensation intact distally Psychiatric: Patient is competent for consent with normal mood and affect Lymphatic: No axillary or cervical lymphadenopathy  MUSCULOSKELETAL:  Examination of left hip and leg:  No open wounds or overlying skin changes. He does have some tenderness along the greater trochanter. Mild tenderness with log roll. Pain in the groin with Stinchfield maneuver.Distally he has sensation intact to light touch in the deep and superficial peroneal nerve, saphenous nerve, sural nerve, and tibial nerve. He has motor  intact as well with tib ant/gastrocsoleus/flexor hallux longus/extensor hallucis longs. 2+ dorsalis pedis pulse.  Assessment: Left nondisplaced peritrochanteric hip fracture.  Plan: - I have reviewed the diagnostic images as well as physical exam findings with the patient and his family. Our recommendation is for proceeding with intramedullary nailing of the left hip with a intertrochanteric fixation device. We reviewed the risks, benefits, and indications of this procedure including but not limited to bleeding, infection, damage to surrounding neurovascular structures, nonunion, malunion, hardware failure, blood clots and the risk of anesthesia. He did provide informed consent. - He will be weightbearing as tolerated to the left large cavity postoperatively. He will get occupational and physical therapy on the floor and be discharged home when appropriate. - QUESTIONS were solicited and answered to his and his children's satisfaction.    Yolonda Kida, MD Cell [phone]    02/05/2017 5:52 PM

## 2017-02-05 NOTE — Brief Op Note (Signed)
02/05/2017  7:10 PM  PATIENT:  Mark Hansen  71 y.o. male  PRE-OPERATIVE DIAGNOSIS:  fractured left hip  POST-OPERATIVE DIAGNOSIS:  fractured left hip  PROCEDURE:  Procedure(s): INTRAMEDULLARY (IM) NAIL FEMORAL LEFT (Left)  SURGEON:  Surgeon(s) and Role:    * Yolonda Kida, MD - Primary  PHYSICIAN ASSISTANT:   ASSISTANTS: none   ANESTHESIA:   general  EBL:  No intake/output data recorded.  BLOOD ADMINISTERED:none  DRAINS: none   LOCAL MEDICATIONS USED:  NONE  SPECIMEN:  No Specimen  DISPOSITION OF SPECIMEN:  N/A  COUNTS:  YES  TOURNIQUET:  * No tourniquets in log *  DICTATION: .Note written in EPIC  PLAN OF CARE: Admit to inpatient   PATIENT DISPOSITION:  PACU - hemodynamically stable.   Delay start of Pharmacological VTE agent (>24hrs) due to surgical blood loss or risk of bleeding: not applicable

## 2017-02-05 NOTE — Anesthesia Preprocedure Evaluation (Addendum)
Anesthesia Evaluation  Patient identified by MRN, date of birth, ID band Patient awake    Reviewed: Allergy & Precautions, H&P , NPO status , Patient's Chart, lab work & pertinent test results  Airway Mallampati: II  TM Distance: >3 FB Neck ROM: Full    Dental no notable dental hx. (+) Dental Advisory Given, Teeth Intact   Pulmonary neg pulmonary ROS, former smoker,    Pulmonary exam normal breath sounds clear to auscultation       Cardiovascular + Peripheral Vascular Disease   Rhythm:Regular Rate:Normal     Neuro/Psych Anxiety negative neurological ROS  negative psych ROS   GI/Hepatic Neg liver ROS, GERD  Controlled,  Endo/Other  negative endocrine ROS  Renal/GU negative Renal ROS  negative genitourinary   Musculoskeletal   Abdominal   Peds  Hematology negative hematology ROS (+)   Anesthesia Other Findings   Reproductive/Obstetrics negative OB ROS                            Anesthesia Physical Anesthesia Plan  ASA: II  Anesthesia Plan: General   Post-op Pain Management:    Induction: Intravenous  PONV Risk Score and Plan: 3 and Ondansetron, Dexamethasone and Midazolam  Airway Management Planned: Oral ETT  Additional Equipment:   Intra-op Plan:   Post-operative Plan: Extubation in OR  Informed Consent: I have reviewed the patients History and Physical, chart, labs and discussed the procedure including the risks, benefits and alternatives for the proposed anesthesia with the patient or authorized representative who has indicated his/her understanding and acceptance.   Dental advisory given  Plan Discussed with: CRNA and Surgeon  Anesthesia Plan Comments:        Anesthesia Quick Evaluation

## 2017-02-05 NOTE — ED Notes (Signed)
Patient transported to CT 

## 2017-02-05 NOTE — Op Note (Signed)
Date of Surgery: 02/05/2017  INDICATIONS: Mr. Mark Hansen is a 71 y.o.-year-old male who sustained a left peritrochanteric hip fracture. He was in his normal state of health earlier today when he isn't to change the batteries in a smoke detector. He fell off of that stool and landed on his left hip. He was found in the emergency department to have a nondisplaced left peritrochanteric hip fracture that was indicated for operative stabilization to facilitate early ambulation. Prior to this fall he was independent with all ADLs. He did not require any assistive devices.The risks and benefits of the procedure discussed with the patient prior to the procedure and all questions were answered; consent was obtained.  Risk discussed included but were not limited to bleeding, infection, damage to surrounding neurovascular structures, malunion, nonunion, painful hardware, need for future surgery, development of blood clots, and risk of anesthesia.  PREOPERATIVE DIAGNOSIS: left hip fracture   POSTOPERATIVE DIAGNOSIS: Same   PROCEDURE: Treatment of intertrochanteric, pertrochanteric, subtrochanteric fracture with intramedullary implant. CPT [phone]   SURGEON: Kathi Der. Aundria Rud, M.D.   ANESTHESIA: general   IV FLUIDS AND URINE: See anesthesia record   ESTIMATED BLOOD LOSS: 150 mL's  IMPLANTS:  Synthes TFNA 10 mm x 170 mm 5 mm compression screw 5 x 34 mm distal interlock screw  DRAINS: None.   COMPLICATIONS: None.   DESCRIPTION OF PROCEDURE: The patient was brought to the operating room and placed supine on the operating table. The patient's leg had been signed prior to the procedure. The patient had the anesthesia placed by the anesthesiologist. The prep verification and incision time-outs were performed to confirm that this was the correct patient, site, side and location. The patient had an SCD on the opposite lower extremity. The patient did receive antibiotics prior to the incision and was re-dosed  during the procedure as needed at indicated intervals. The patient was positioned on the fracture table with the table in traction and internal rotation to reduce the hip. The well leg was placed in a scissor position and all bony prominences were well-padded. The patient had the lower extremity prepped and draped in the standard surgical fashion. The incision was made 4 finger breadths superior to the greater trochanter. A guide pin was inserted into the tip of the greater trochanter under fluoroscopic guidance. An opening reamer was used to gain access to the femoral canal. The nail length was measured and inserted down the femoral canal to its proper depth. The appropriate version of insertion for the lag screw was found under fluoroscopy. A pin was inserted up the femoral neck through the jig. The length of the lag screw was then measured. The lag screw was inserted as near to center-center in the head as possible. X-rays were utilized to confirm near center center position of the compression screw.  The nail and compression screw were then locked into a static position.  We next turned our attention to the distal interlocking screw.  tilizing the appropriate slot on the out rigger for the nail we inserted the drill guide.  Next we drilled bicortically and through the nail with the appropriate drill bit. We measured this off of the drill guide. We then inserted a 34 mm distal interlocking screw through the nail and bicortically. This had excellent bite.   The wound was copiously irrigated with saline and the subcutaneous layer closed with 2.0 vicryl and the skin was reapproximated with staples. The wounds were cleaned and dried a final time and a  sterile dressing was placed. The hip was taken through a range of motion at the end of the case under fluoroscopic imaging to visualize the approach-withdraw phenomenon and confirm implant length in the head. The patient was then awakened from anesthesia and taken  to the recovery room in stable condition. All counts were correct at the end of the case.   POSTOPERATIVE PLAN: The patient will be weight bearing as tolerated and will return in 2 weeks for staple removal and the patient will receive DVT prophylaxis based on other medications, activity level, and risk ratio of bleeding to thrombosis.  We will recommend twice daily aspirin for DVT prophylaxis for 6 weeks.   Maryan Rued, MD [facility] [phone] 7:12 PM

## 2017-02-05 NOTE — Transfer of Care (Signed)
Immediate Anesthesia Transfer of Care Note  Patient: Mark Hansen  Procedure(s) Performed: Procedure(s): INTRAMEDULLARY (IM) NAIL FEMORAL LEFT (Left)  Patient Location: PACU  Anesthesia Type:General  Level of Consciousness:  sedated, patient cooperative and responds to stimulation  Airway & Oxygen Therapy:Patient Spontanous Breathing and Patient connected to face mask oxgen  Post-op Assessment:  Report given to PACU RN and Post -op Vital signs reviewed and stable  Post vital signs:  Reviewed and stable  Last Vitals:  Vitals:   02/05/17 1700 02/05/17 1917  BP: (!) 151/86   Pulse: 80 90  Resp: 14 16  Temp:    SpO2: 96%     Complications: No apparent anesthesia complications

## 2017-02-06 DIAGNOSIS — S72002A Fracture of unspecified part of neck of left femur, initial encounter for closed fracture: Secondary | ICD-10-CM

## 2017-02-06 LAB — CBC
HEMATOCRIT: 36.1 % — AB (ref 39.0–52.0)
HEMOGLOBIN: 12.2 g/dL — AB (ref 13.0–17.0)
MCH: 29.2 pg (ref 26.0–34.0)
MCHC: 33.8 g/dL (ref 30.0–36.0)
MCV: 86.4 fL (ref 78.0–100.0)
Platelets: 130 10*3/uL — ABNORMAL LOW (ref 150–400)
RBC: 4.18 MIL/uL — ABNORMAL LOW (ref 4.22–5.81)
RDW: 12.6 % (ref 11.5–15.5)
WBC: 7.7 10*3/uL (ref 4.0–10.5)

## 2017-02-06 LAB — BASIC METABOLIC PANEL
ANION GAP: 9 (ref 5–15)
BUN: 12 mg/dL (ref 6–20)
CHLORIDE: 105 mmol/L (ref 101–111)
CO2: 26 mmol/L (ref 22–32)
Calcium: 7.9 mg/dL — ABNORMAL LOW (ref 8.9–10.3)
Creatinine, Ser: 0.86 mg/dL (ref 0.61–1.24)
GFR calc Af Amer: >60 mL/min (ref >60)
GLUCOSE: 176 mg/dL — AB (ref 65–99)
POTASSIUM: 4.3 mmol/L (ref 3.5–5.1)
Sodium: 140 mmol/L (ref 135–145)

## 2017-02-06 LAB — HEPATIC FUNCTION PANEL
ALBUMIN: 2.9 g/dL — AB (ref 3.5–5.0)
ALK PHOS: 43 U/L (ref 38–126)
ALT: 14 U/L — ABNORMAL LOW (ref 17–63)
AST: 25 U/L (ref 15–41)
BILIRUBIN TOTAL: 1.3 mg/dL — AB (ref 0.3–1.2)
Bilirubin, Direct: 0.2 mg/dL (ref 0.1–0.5)
Indirect Bilirubin: 1.1 mg/dL — ABNORMAL HIGH (ref 0.3–0.9)
Total Protein: 5 g/dL — ABNORMAL LOW (ref 6.5–8.1)

## 2017-02-06 MED ORDER — HYDROCODONE-ACETAMINOPHEN 5-325 MG PO TABS
1.0000 | ORAL_TABLET | Freq: Four times a day (QID) | ORAL | Status: DC | PRN
  Administered 2017-02-07 (×2): 1 via ORAL
  Filled 2017-02-06 (×3): qty 1

## 2017-02-06 MED ORDER — MORPHINE SULFATE (PF) 4 MG/ML IV SOLN: 2.0000 mg | INTRAVENOUS | Status: DC | PRN

## 2017-02-06 NOTE — Progress Notes (Signed)
 @        PROGRESS NOTE                                                                                                                                                                                                             Patient Demographics:    Mark Hansen, is a 71 y.o. male, DOB - Dec 17, 1945, ZOX:[id number]  Admit date - 02/05/2017   Admitting Physician A Grier Mitts., MD  Outpatient Primary MD for the patient is Abelina Bachelor, MD  LOS - 1  Chief Complaint  Patient presents with  . Fall  . Hip Pain    left side       Brief Narrative  Mark Hansen is a 71 y.o. male with chart history of GERD, PVD, BPH presenting after a mechanical fall with left hip fracture.   Subjective:    Mark Hansen today has, No headache, No chest pain, No abdominal pain - No Nausea, No new weakness tingling or numbness, No Cough - SOB. Mild postop left hip pain.   Assessment  & Plan :     1.Mechanical fall causing left hip fracture - he is status post Intramedullary left femoral nail placement by Dr. Aundria Rud on 02/05/2017, as tolerated the procedure well with minimal perioperative blood loss related anemia not requiring transfusion, per orthopedics weightbearing as tolerated, initiate PT, continue pain control, monitor H&H, discussed with Dr. Aundria Rud over the phone on 02/06/2017 Lovenox for DVT prophylaxis while he is in the hospital thereafter aspirin 81 twice a day or total of 6 weeks.  2. History of smoking with possible undiagnosed COPD. Counseled to quit smoking no acute issues. Follow-up with PCP.  3. Minimally elevated total bilirubin of no clear significance. 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 No right upper quadrant pain or discomfort.     Diet : Diet regular Room service appropriate? Yes; Fluid consistency: Thin    Family Communication  : None  Code Status :  Full  Disposition Plan  :  We  decided  Consults  :  Orthopedics  Procedures  :  Intramedullary left femoral nail placement by Dr. Aundria Rud on 02/05/2017  DVT Prophylaxis  :  Lovenox While in the hospital then aspirin 81 mg by mouth twice a day per Dr. Junita Push, discussed with him personally on 02/06/2017 over the phone  Lab Results  Component Value Date   PLT 130 (L) 02/06/2017    Inpatient Medications  Scheduled Meds: .  enoxaparin (LOVENOX) injection  30 mg Subcutaneous Q24H   Continuous Infusions: PRN Meds:.acetaminophen **OR** acetaminophen, fentaNYL (SUBLIMAZE) injection, HYDROcodone-acetaminophen, morphine injection, [DISCONTINUED] ondansetron **OR** ondansetron (ZOFRAN) IV, oxyCODONE  Antibiotics  :    Anti-infectives    Start     Dose/Rate Route Frequency Ordered Stop   02/05/17 2359  ceFAZolin (ANCEF) IVPB 1 g/50 mL premix     1 g 100 mL/hr over 30 Minutes Intravenous Every 6 hours 02/05/17 2100 02/06/17 1228   02/05/17 1752  ceFAZolin (ANCEF) 2-4 GM/100ML-% IVPB    Comments:  Kym Groom   : cabinet override      02/05/17 1752 02/05/17 1800         Objective:   Vitals:   02/05/17 2333 02/06/17 0059 02/06/17 0622 02/06/17 0958  BP: (!) 100/58 122/61 119/71 122/68  Pulse: 89 82 78 74  Resp: Temp: 98 F (36.7 C) 98 F (36.7 C) 97.9 F (36.6 C) 98.1 F (36.7 C)  TempSrc: Oral Oral Oral Oral  SpO2: 100% 100% 100% 100%  Weight:      Height:        Wt Readings from Last 3 Encounters:  02/05/17 60 kg (132 lb 4.4 oz)  02/05/17 57.2 kg (126 lb)  06/21/12 57.5 kg (126 lb 12.8 oz)     Intake/Output Summary (Last 24 hours) at 02/06/17 1229 Last data filed at 02/06/17 0958  Gross per 24 hour  Intake          4886.24 ml  Output             2680 ml  Net          2206.24 ml     Physical Exam  Awake Alert, Oriented X 3, No new F.N deficits, Normal affect [place].AT,PERRAL Supple Neck,No JVD, No cervical lymphadenopathy appriciated.  Symmetrical Chest wall movement, Good air  movement bilaterally, CTAB RRR,No Gallops,Rubs or new Murmurs, No Parasternal Heave +ve B.Sounds, Abd Soft, No tenderness, No organomegaly appriciated, No rebound - guarding or rigidity. No Cyanosis, Clubbing or edema, No new Rash or bruise , Left hip postop scar stable hematoma    Data Review:    CBC  Recent Labs Lab 02/05/17 1519 02/06/17 0458  WBC 7.0 7.7  HGB 15.8 12.2*  HCT 46.3 36.1*  PLT 138* 130*  MCV 85.9 86.4  MCH 29.3 29.2  MCHC 34.1 33.8  RDW 12.5 12.6    Chemistries   Recent Labs Lab 02/05/17 1519 02/06/17 0458  NA 140 140  K 3.9 4.3  CL 103 105  CO2 30 26  GLUCOSE 101* 176*  BUN 17 12  CREATININE 0.87 0.86  CALCIUM 8.7* 7.9*  AST 22 25  ALT 19 14*  ALKPHOS 65 43  BILITOT 2.5* 1.3*   ------------------------------------------------------------------------------------------------------------------ No results for input(s): CHOL, HDL, LDLCALC, TRIG, CHOLHDL, LDLDIRECT in the last 72 hours.  Lab Results  Component Value Date   HGBA1C 5.6 06/19/2012   ------------------------------------------------------------------------------------------------------------------ No results for input(s): TSH, T4TOTAL, T3FREE, THYROIDAB in the last 72 hours.  Invalid input(s): FREET3 ------------------------------------------------------------------------------------------------------------------ No results for input(s): VITAMINB12, FOLATE, FERRITIN, TIBC, IRON, RETICCTPCT in the last 72 hours.  Coagulation profile  Recent Labs Lab 02/05/17 1519  INR 0.99    No results for input(s): DDIMER in the last 72 hours.  Cardiac Enzymes No results for input(s): CKMB, TROPONINI, MYOGLOBIN in the last 168 hours.  Invalid input(s): CK ------------------------------------------------------------------------------------------------------------------ No results found for: BNP  Micro Results No results found  for this or any previous visit (from the past 240  hour(s)).  Radiology Reports Mr Hip Left Wo Contrast  Result Date: 02/05/2017 CLINICAL DATA:  Left hip pain since a fall off a stool this morning. Initial encounter. EXAM: MR OF THE LEFT HIP WITHOUT CONTRAST TECHNIQUE: Multiplanar, multisequence MR imaging was performed. No intravenous contrast was administered. COMPARISON:  Plain films left hip 02/05/2017 FINDINGS: Bones: The patient has an acute subcapital left hip fracture. The fracture extends into the intertrochanteric left femur. Associated marrow edema is identified. Bone marrow signal is otherwise normal. Articular cartilage and labrum Articular cartilage:  Preserved. Labrum:  Intact. Joint or bursal effusion Joint effusion:  Small left hip effusion is noted. Bursae:  Normal. Muscles and tendons Muscles and tendons:  Intact. Other findings Miscellaneous: Imaged intrapelvic contents demonstrate mild prostatomegaly. IMPRESSION: Acute subcapital left hip fracture extends into the intertrochanteric left femur. No other acute abnormality. Mild prostatomegaly. Electronically Signed   By: Drusilla Kanner M.D.   On: 02/05/2017 13:03   Dg Chest Port 1 View  Result Date: 02/05/2017 CLINICAL DATA:  Preoperative examination. EXAM: PORTABLE CHEST 1 VIEW COMPARISON:  Chest x-ray dated June 19, 2012. FINDINGS: The cardiomediastinal silhouette is normal in size. Normal pulmonary vascularity. Hyperinflated lungs with emphysematous changes. No focal consolidation, pleural effusion, or pneumothorax. No acute osseous abnormality. IMPRESSION: COPD.  No active cardiopulmonary disease. Electronically Signed   By: Obie Dredge M.D.   On: 02/05/2017 15:49   Dg C-arm 1-60 Min-no Report  Result Date: 02/05/2017 Fluoroscopy was utilized by the requesting physician.  No radiographic interpretation.   Dg Hip Operative Unilat W Or W/o Pelvis Left  Result Date: 02/05/2017 CLINICAL DATA:  Internal fixation of left hip fracture. Initial encounter. EXAM: OPERATIVE  LEFT HIP (WITH PELVIS IF PERFORMED) 2 VIEWS TECHNIQUE: Fluoroscopic spot image(s) were submitted for interpretation post-operatively. COMPARISON:  Left hip radiographs performed earlier today at 8:59 a.m. FINDINGS: Two fluoroscopic C-arm images are provided from the OR, demonstrating placement of a left hip screw transfixing the known intertrochanteric fracture in anatomic alignment. The left femoral head remains seated at the acetabulum. IMPRESSION: Status post internal fixation of left femoral intertrochanteric fracture in anatomic alignment. Electronically Signed   By: Roanna Raider M.D.   On: 02/05/2017 22:04   Dg Hip Unilat With Pelvis 2-3 Views Left  Result Date: 02/05/2017 CLINICAL DATA:  Larey Seat from stool and landed on left side. Complains of left hip pain. EXAM: DG HIP (WITH OR WITHOUT PELVIS) 2-3V LEFT COMPARISON:  None. FINDINGS: Pelvic bony ring is intact. Left hip is located without a fracture. Normal appearance of the SI joints. No significant joint space narrowing in either hip. IMPRESSION: No acute bone abnormality to the pelvis or left hip. If there is high clinical suspicion for an occult left hip fracture, recommend further characterization with CT or MRI. Electronically Signed   By: Richarda Overlie M.D.   On: 02/05/2017 09:14    Time Spent in minutes  30   Susa Raring M.D on 02/06/2017 at 12:29 PM  Between 7am to 7pm - Pager - [phone] ( page via [email or web address], text pages only, please mention full 10 digit call back number). After 7pm go to [email or web address] - password [email or web address]

## 2017-02-06 NOTE — Evaluation (Signed)
Occupational Therapy Evaluation Patient Details Name: Mark Hansen MRN:Mark StefankoDOB: 03/31/1946 Today's Date: 02/06/2017    History of Present Illness INTRAMEDULLARY (IM) NAIL FEMORAL LEFT (Left)   Clinical Impression  Pt admitted with fall in which he underwent L IM nail. Pt currently with functional limitations due to the deficits listed below (see OT Problem List).  Pt will benefit from skilled OT to increase their safety and independence with ADL and functional mobility for ADL to facilitate discharge to venue listed below.      Follow Up Recommendations  Home health OT;Supervision/Assistance - 24 hour    Equipment Recommendations  3 in 1 bedside commode    Recommendations for Other Services       Precautions / Restrictions Precautions Precautions: None Restrictions LLE Weight Bearing: Weight bearing as tolerated      Mobility Bed Mobility               General bed mobility comments: pt in chair  Transfers Overall transfer level: Needs assistance                        ADL either performed or assessed with clinical judgement   ADL Overall ADL's : Needs assistance/impaired Eating/Feeding: Set up;Sitting   Grooming: Set up;Sitting   Upper Body Bathing: Set up;Sitting   Lower Body Bathing: Moderate assistance;Sit to/from stand;Cueing for sequencing;Cueing for safety   Upper Body Dressing : Set up;Sitting   Lower Body Dressing: Moderate assistance;Sit to/from stand;Cueing for safety;Cueing for sequencing                       Vision Patient Visual Report: No change from baseline              Pertinent Vitals/Pain Pain Assessment: No/denies pain     Hand Dominance     Extremity/Trunk Assessment Upper Extremity Assessment Upper Extremity Assessment: Overall WFL for tasks assessed           Communication Communication Communication: No difficulties   Cognition Arousal/Alertness: Awake/alert Behavior During  Therapy: WFL for tasks assessed/performed Overall Cognitive Status: Within Functional Limits for tasks assessed                                                Home Living Family/patient expects to be discharged to:: Private residence Living Arrangements: Children Available Help at Discharge: Family Type of Home: House       Home Layout: One level     Bathroom Shower/Tub: Producer, television/film/video: Standard     Home Equipment: Environmental consultant - 4 wheels          Prior Functioning/Environment Level of Independence: Independent                 OT Problem List: Decreased strength;Impaired balance (sitting and/or standing)      OT Treatment/Interventions: Self-care/ADL training;DME and/or AE instruction;Patient/family education    OT Goals(Current goals can be found in the care plan section) Acute Rehab OT Goals Patient Stated Goal: home with daugther OT Goal Formulation: With patient Time For Goal Achievement: 02/20/17 Potential to Achieve Goals: Good  OT Frequency: Min 2X/week   Barriers to D/C:               AM-PAC PT "6 Clicks" Daily Activity  Outcome Measure Help from another person eating meals?: None Help from another person taking care of personal grooming?: None Help from another person toileting, which includes using toliet, bedpan, or urinal?: A Lot Help from another person bathing (including washing, rinsing, drying)?: A Lot Help from another person to put on and taking off regular upper body clothing?: None Help from another person to put on and taking off regular lower body clothing?: A Lot 6 Click Score: 18   End of Session Equipment Utilized During Treatment: Rolling walker Nurse Communication: Mobility status  Activity Tolerance: Patient tolerated treatment well Patient left: in chair  OT Visit Diagnosis: Unsteadiness on feet (R26.81);Muscle weakness (generalized) (M62.81)                Time: 1455-1520 OT Time  Calculation (min): 25 min Charges:  OT General Charges $OT Visit: 1 Visit OT Evaluation $OT Eval Low Complexity: 1 Low OT Treatments $Self Care/Home Management : 8-22 mins G-Codes:     Lise Auer, OT [phone]  Einar Crow D 02/06/2017, 3:41 PM

## 2017-02-06 NOTE — Evaluation (Signed)
Physical Therapy Evaluation Patient Details Name: Mark Hansen MRN: [id number] DOB: 20-Sep-1945 Today's Date: 02/06/2017   History of Present Illness  INTRAMEDULLARY (IM) NAIL FEMORAL LEFT (Left)  Clinical Impression  Pt is up to walk with PT but definitely is distracted with his conversation with PT.  Talked with him about tasks and attempted to keep him focused but he is going to need SNF care any rate due to his balance strength and safety awareness.  WIll continue acutely and progress with his mobility as he tolerates, and may need to keep a chair close to manage his safety.    Follow Up Recommendations SNF    Equipment Recommendations  Rolling walker with 5" wheels    Recommendations for Other Services       Precautions / Restrictions Precautions Precautions: None Restrictions Weight Bearing Restrictions: Yes LLE Weight Bearing: Weight bearing as tolerated      Mobility  Bed Mobility Overal bed mobility: Needs Assistance Bed Mobility: Supine to Sit     Supine to sit: Mod assist     General bed mobility comments: assisted with bed to bedside with mod assist under trunk and to pivot to edge of bed  Transfers Overall transfer level: Needs assistance Equipment used: Rolling walker (2 wheeled);1 person hand held assist Transfers: Sit to/from Stand Sit to Stand: Min assist;Mod assist         General transfer comment: cues 100% of the time for sitting and standing hand placement  Ambulation/Gait Ambulation/Gait assistance: Min assist;Min guard Ambulation Distance (Feet): 30 Feet Assistive device: Rolling walker (2 wheeled);1 person hand held assist Gait Pattern/deviations: Step-to pattern;Step-through pattern;Wide base of support;Trunk flexed;Shuffle;Decreased stride length;Decreased stance time - left Gait velocity: reduced Gait velocity interpretation: Below normal speed for age/gender General Gait Details: reminders for posture and maneuvering safely in his  room  Stairs            Wheelchair Mobility    Modified Rankin (Stroke Patients Only)       Balance Overall balance assessment: History of Falls;Needs assistance Sitting-balance support: Feet supported Sitting balance-Leahy Scale: Fair     Standing balance support: Bilateral upper extremity supported;During functional activity Standing balance-Leahy Scale: Poor Standing balance comment: pt requires RW for support but is able to WB through LLE                              Pertinent Vitals/Pain Pain Assessment: No/denies pain    Home Living Family/patient expects to be discharged to:: Private residence Living Arrangements: Children Available Help at Discharge: Family Type of Home: House       Home Layout: One level Home Equipment: Environmental consultant - 4 wheels      Prior Function Level of Independence: Independent               Hand Dominance        Extremity/Trunk Assessment   Upper Extremity Assessment Upper Extremity Assessment: Overall WFL for tasks assessed    Lower Extremity Assessment Lower Extremity Assessment: LLE deficits/detail LLE Deficits / Details: L hip strength was 2+ and pain with all movement LLE: Unable to fully assess due to pain LLE Sensation: decreased proprioception LLE Coordination: decreased fine motor;decreased gross motor    Cervical / Trunk Assessment Cervical / Trunk Assessment: Normal  Communication   Communication: No difficulties  Cognition Arousal/Alertness: Awake/alert Behavior During Therapy: WFL for tasks assessed/performed Overall Cognitive Status: Within Functional Limits for tasks assessed  General Comments      Exercises General Exercises - Lower Extremity Ankle Circles/Pumps: AROM;AAROM;Both;10 reps Quad Sets: AROM;AAROM;Both;10 reps Gluteal Sets: AROM;AAROM;Both;10 reps Heel Slides: AAROM;AROM;Both;10 reps   Assessment/Plan    PT  Assessment Patient needs continued PT services  PT Problem List Decreased strength;Decreased range of motion;Decreased activity tolerance;Decreased balance;Decreased mobility;Decreased coordination;Decreased knowledge of use of DME;Decreased cognition;Decreased safety awareness;Decreased skin integrity;Pain       PT Treatment Interventions DME instruction;Gait training;Stair training;Functional mobility training;Therapeutic activities;Therapeutic exercise;Balance training;Neuromuscular re-education;Patient/family education    PT Goals (Current goals can be found in the Care Plan section)  Acute Rehab PT Goals Patient Stated Goal: home with daugther PT Goal Formulation: With patient Time For Goal Achievement: 02/20/17 Potential to Achieve Goals: Good    Frequency 7X/week   Barriers to discharge Inaccessible home environment;Decreased caregiver support daughter works and his house has stairs to enter    [employer] PT "6 Clicks" Daily Activity  Outcome Measure Difficulty turning over in bed (including adjusting bedclothes, sheets and blankets)?: A Lot Difficulty moving from lying on back to sitting on the side of the bed? : Unable Difficulty sitting down on and standing up from a chair with arms (e.g., wheelchair, bedside commode, etc,.)?: Unable Help needed moving to and from a bed to chair (including a wheelchair)?: A Little Help needed walking in hospital room?: A Little Help needed climbing 3-5 steps with a railing? : A Lot 6 Click Score: 12    End of Session Equipment Utilized During Treatment: Gait belt Activity Tolerance: Patient tolerated treatment well;Patient limited by pain Patient left: in chair;with call bell/phone within reach;with nursing/sitter in room Nurse Communication: Mobility status;Other (comment) (removed O2 sat monitor due to pt being 100% after activity) PT Visit Diagnosis: Unsteadiness on feet (R26.81);Repeated falls  (R29.6);Muscle weakness (generalized) (M62.81);Difficulty in walking, not elsewhere classified (R26.2);Pain Pain - Right/Left: Left Pain - part of body: Hip    Time: 0454-0981 PT Time Calculation (min) (ACUTE ONLY): 44 min   Charges:   PT Evaluation $PT Eval Moderate Complexity: 1 Mod PT Treatments $Gait Training: 8-22 mins $Therapeutic Exercise: 8-22 mins   PT G Codes:   PT G-Codes **NOT FOR INPATIENT CLASS** Functional Assessment Tool Used: AM-PAC 6 Clicks Basic Mobility    Ivar Drape 02/06/2017, 5:59 PM   Samul Dada, PT MS Acute Rehab Dept. Number: [facility] [id number] and [facility] [phone]

## 2017-02-06 NOTE — Progress Notes (Signed)
   Subjective: 1 Day Post-Op Procedure(s) (LRB): INTRAMEDULLARY (IM) NAIL FEMORAL LEFT (Left)  Mild to moderate soreness in the hip especially with movement Denies any new symptoms or issues Otherwise doing fair Patient reports pain as moderate.  Objective:   VITALS:   Vitals:   02/06/17 0059 02/06/17 0622  BP: 122/61 119/71  Pulse: 82 78  Resp: 15 15  Temp: 98 F (36.7 C) 97.9 F (36.6 C)  SpO2: 100% 100%    Left hip incisions healing well nv intact distally No rashes or edema  LABS  Recent Labs  02/05/17 1519 02/06/17 0458  HGB 15.8 12.2*  HCT 46.3 36.1*  WBC 7.0 7.7  PLT 138* 130*     Recent Labs  02/05/17 1519 02/06/17 0458  NA 140 140  K 3.9 4.3  BUN 17 12  CREATININE 0.87 0.86  GLUCOSE 101* 176*     Assessment/Plan: 1 Day Post-Op Procedure(s) (LRB): INTRAMEDULLARY (IM) NAIL FEMORAL LEFT (Left) PT/OT Pain management D/c planning Pulmonary toilet   Brad Glennda Weatherholtz, MPAS, PA-C  02/06/2017, 9:15 AM

## 2017-02-07 LAB — COMPREHENSIVE METABOLIC PANEL
ALT: 11 U/L — ABNORMAL LOW (ref 17–63)
ANION GAP: 4 — AB (ref 5–15)
AST: 18 U/L (ref 15–41)
Albumin: 2.7 g/dL — ABNORMAL LOW (ref 3.5–5.0)
Alkaline Phosphatase: 43 U/L (ref 38–126)
BILIRUBIN TOTAL: 0.6 mg/dL (ref 0.3–1.2)
BUN: 14 mg/dL (ref 6–20)
CHLORIDE: 105 mmol/L (ref 101–111)
CO2: 31 mmol/L (ref 22–32)
Calcium: 7.8 mg/dL — ABNORMAL LOW (ref 8.9–10.3)
Creatinine, Ser: 0.88 mg/dL (ref 0.61–1.24)
Glucose, Bld: 105 mg/dL — ABNORMAL HIGH (ref 65–99)
POTASSIUM: 3.8 mmol/L (ref 3.5–5.1)
Sodium: 140 mmol/L (ref 135–145)
TOTAL PROTEIN: 4.5 g/dL — AB (ref 6.5–8.1)

## 2017-02-07 LAB — CBC
HEMATOCRIT: 29.8 % — AB (ref 39.0–52.0)
Hemoglobin: 10.2 g/dL — ABNORMAL LOW (ref 13.0–17.0)
MCH: 29.7 pg (ref 26.0–34.0)
MCHC: 34.2 g/dL (ref 30.0–36.0)
MCV: 86.9 fL (ref 78.0–100.0)
PLATELETS: 120 10*3/uL — AB (ref 150–400)
RBC: 3.43 MIL/uL — ABNORMAL LOW (ref 4.22–5.81)
RDW: 12.8 % (ref 11.5–15.5)
WBC: 7.8 10*3/uL (ref 4.0–10.5)

## 2017-02-07 MED ORDER — PHENAZOPYRIDINE HCL 200 MG PO TABS
200.0000 mg | ORAL_TABLET | Freq: Three times a day (TID) | ORAL | Status: AC
  Administered 2017-02-07 (×2): 200 mg via ORAL
  Filled 2017-02-07 (×4): qty 1

## 2017-02-07 MED ORDER — BISACODYL 5 MG PO TBEC
5.0000 mg | DELAYED_RELEASE_TABLET | Freq: Every day | ORAL | Status: DC
  Administered 2017-02-07 – 2017-02-08 (×2): 5 mg via ORAL
  Filled 2017-02-07 (×2): qty 1

## 2017-02-07 MED ORDER — DOCUSATE SODIUM 100 MG PO CAPS
200.0000 mg | ORAL_CAPSULE | Freq: Two times a day (BID) | ORAL | Status: DC
  Administered 2017-02-07 – 2017-02-08 (×3): 200 mg via ORAL
  Filled 2017-02-07 (×3): qty 2

## 2017-02-07 MED ORDER — POLYETHYLENE GLYCOL 3350 17 G PO PACK
17.0000 g | PACK | Freq: Every day | ORAL | Status: DC
  Administered 2017-02-07 – 2017-02-08 (×2): 17 g via ORAL
  Filled 2017-02-07 (×2): qty 1

## 2017-02-07 MED ORDER — ALUM & MAG HYDROXIDE-SIMETH 200-200-20 MG/5ML PO SUSP
30.0000 mL | Freq: Four times a day (QID) | ORAL | Status: DC | PRN
  Administered 2017-02-07: 30 mL via ORAL
  Filled 2017-02-07: qty 30

## 2017-02-07 MED ORDER — FERROUS SULFATE 325 (65 FE) MG PO TABS
325.0000 mg | ORAL_TABLET | Freq: Three times a day (TID) | ORAL | Status: DC
  Administered 2017-02-07 – 2017-02-08 (×5): 325 mg via ORAL
  Filled 2017-02-07 (×5): qty 1

## 2017-02-07 NOTE — Anesthesia Postprocedure Evaluation (Signed)
Anesthesia Post Note  Patient: Mark Hansen  Procedure(s) Performed: INTRAMEDULLARY (IM) NAIL FEMORAL LEFT (Left Hip)     Patient location during evaluation: PACU Anesthesia Type: General Level of consciousness: awake and alert Pain management: pain level controlled Vital Signs Assessment: post-procedure vital signs reviewed and stable Respiratory status: spontaneous breathing, nonlabored ventilation, respiratory function stable and patient connected to nasal cannula oxygen Cardiovascular status: blood pressure returned to baseline and stable Postop Assessment: no apparent nausea or vomiting Anesthetic complications: no    Last Vitals:  Vitals:   02/06/17 2205 02/07/17 0420  BP: 126/67 (!) 98/56  Pulse: 97 83  Resp: 18   Temp: 36.9 C 36.7 C  SpO2: 99% 100%    Last Pain:  Vitals:   02/07/17 0435  TempSrc:   PainSc: Asleep                 Serinity Ware,W. EDMOND

## 2017-02-07 NOTE — Plan of Care (Signed)
Problem: Safety: Goal: Ability to remain free from injury will improve Outcome: Progressing No fall or injury noted, safety precautions mainatined  Problem: Pain Managment: Goal: General experience of comfort will improve Outcome: Progressing Medicated once for pain with moderate relief  Problem: Physical Regulation: Goal: Will remain free from infection Outcome: Progressing No signs of infection noted  Problem: Skin Integrity: Goal: Risk for impaired skin integrity will decrease Outcome: Progressing No skin impairement noted  Problem: Tissue Perfusion: Goal: Risk factors for ineffective tissue perfusion will decrease No signs of DVT noted, SCDs are on, patient is on Lovenox prophylaxis  Problem: Bowel/Gastric: Goal: Will not experience complications related to bowel motility Outcome: Progressing No bowel complications reported

## 2017-02-07 NOTE — Progress Notes (Signed)
Subjective: 2 Days Post-Op Procedure(s) (LRB): INTRAMEDULLARY (IM) NAIL FEMORAL LEFT (Left) Patient reports pain as moderate to left hip.  Tolerating PO's. Limited progress with PT. Denies CP, SOB, or calf pain.  Objective: Vital signs in last 24 hours: Temp:  [98 F (36.7 C)-98.5 F (36.9 C)] 98 F (36.7 C) (10/14 0420) Pulse Rate:  [74-109] 83 (10/14 0420) Resp:  [16-18] 18 (10/13 2205) BP: (98-129)/(56-71) 98/56 (10/14 0420) SpO2:  [99 %-100 %] 100 % (10/14 0420)  Intake/Output from previous day: 10/13 0701 - 10/14 0700 In: 1920 [P.O.:1920] Out: 2050 [Urine:2050] Intake/Output this shift: Total I/O In: 240 [P.O.:240] Out: -    Recent Labs  02/05/17 1519 02/06/17 0458 02/07/17 0527  HGB 15.8 12.2* 10.2*    Recent Labs  02/06/17 0458 02/07/17 0527  WBC 7.7 7.8  RBC 4.18* 3.43*  HCT 36.1* 29.8*  PLT 130* 120*    Recent Labs  02/06/17 0458 02/07/17 0527  NA 140 140  K 4.3 3.8  CL 105 105  CO2 26 31  BUN 12 14  CREATININE 0.86 0.88  GLUCOSE 176* 105*  CALCIUM 7.9* 7.8*    Recent Labs  02/05/17 1519  INR 0.99    Alert and oriented x3. RRR, Lungs clear, BS x4. Left Calf soft and non tender. L hip dressing C/D/I. No DVT signs. No signs of infection or compartment syndrome. LLE grossly neurovascularly intact.   Assessment/Plan: 2 Days Post-Op Procedure(s) (LRB): INTRAMEDULLARY (IM) NAIL FEMORAL LEFT (Left) Up with PT Pain management  D/c planning D/c foley  Tanza Pellot L 02/07/2017, 9:01 AM

## 2017-02-07 NOTE — Progress Notes (Signed)
Occupational Therapy Treatment Patient Details Name: Mark Hansen MRN: [id number] DOB: 1945-09-18 Today's Date: 02/07/2017    History of present illness INTRAMEDULLARY (IM) NAIL FEMORAL LEFT (Left)   OT comments  DC plan now SNF as pt does not have A as home per son .  Follow Up Recommendations  SNF;Other (comment) (changed as son states he does not have A at home)    Equipment Recommendations  3 in 1 bedside commode    Recommendations for Other Services      Precautions / Restrictions Precautions Precautions: None Restrictions Weight Bearing Restrictions: No LLE Weight Bearing: Weight bearing as tolerated       Mobility Bed Mobility Overal bed mobility: Needs Assistance Bed Mobility: Supine to Sit     Supine to sit: Mod assist        Transfers Overall transfer level: Needs assistance Equipment used: Rolling walker (2 wheeled) Transfers: Sit to/from [employer] Sit to Stand: Mod assist Stand pivot transfers: Mod assist       General transfer comment: cues 100% of the time for sitting and standing hand placement    Balance Overall balance assessment: History of Falls;Needs assistance Sitting-balance support: Feet supported Sitting balance-Leahy Scale: Fair     Standing balance support: Bilateral upper extremity supported;During functional activity Standing balance-Leahy Scale: Poor Standing balance comment: pt requires RW for support but is able to WB through LLE                            ADL either performed or assessed with clinical judgement   ADL Overall ADL's : Needs assistance/impaired                     Lower Body Dressing: Moderate assistance;Sit to/from stand;Cueing for safety;Cueing for sequencing   Toilet Transfer: Moderate assistance;RW;Cueing for safety;Cueing for sequencing Toilet Transfer Details (indicate cue type and reason): bed to chair Toileting- Clothing Manipulation and Hygiene: Maximal  assistance;Sit to/from stand;Cueing for safety;Cueing for sequencing         General ADL Comments: pt needed VC for safety and redirection at times.  son present and Aed with pt listening      Vision Patient Visual Report: No change from baseline            Cognition Arousal/Alertness: Awake/alert Behavior During Therapy: WFL for tasks assessed/performed Overall Cognitive Status: Within Functional Limits for tasks assessed                                                     Pertinent Vitals/ Pain       Pain Score: 4  Pain Location: L hip Pain Descriptors / Indicators: Discomfort;Sore Pain Intervention(s): Limited activity within patient's tolerance         Frequency  Min 2X/week        Progress Toward Goals  OT Goals(current goals can now be found in the care plan section)  Progress towards OT goals: Progressing toward goals     Plan Discharge plan needs to be updated    Co-evaluation                 AM-PAC PT "6 Clicks" Daily Activity     Outcome Measure   Help from another person eating meals?: None Help from another  person taking care of personal grooming?: None Help from another person toileting, which includes using toliet, bedpan, or urinal?: A Lot Help from another person bathing (including washing, rinsing, drying)?: A Lot Help from another person to put on and taking off regular upper body clothing?: None Help from another person to put on and taking off regular lower body clothing?: A Lot 6 Click Score: 18    End of Session Equipment Utilized During Treatment: Rolling walker  OT Visit Diagnosis: Unsteadiness on feet (R26.81);Muscle weakness (generalized) (M62.81)   Activity Tolerance Patient tolerated treatment well   Patient Left in chair   Nurse Communication Mobility status        Time: 1020-1050 OT Time Calculation (min): 30 min  Charges: OT General Charges $OT Visit: 1 Visit OT Treatments $Self  Care/Home Management : 23-37 mins  [place], [place] [phone]   Alba Cory 02/07/2017, 10:58 AM

## 2017-02-07 NOTE — Progress Notes (Signed)
 @        PROGRESS NOTE                                                                                                                                                                                                             Patient Demographics:    Mark Hansen, is a 71 y.o. male, DOB - 04-08-1946, RUE:[id number]  Admit date - 02/05/2017   Admitting Physician A Grier Mitts., MD  Outpatient Primary MD for the patient is Abelina Bachelor, MD  LOS - 2  Chief Complaint  Patient presents with  . Fall  . Hip Pain    left side       Brief Narrative  Mark Hansen is a 71 y.o. male with chart history of GERD, PVD, BPH presenting after a mechanical fall with left hip fracture.   Subjective:   Patient in bed, appears comfortable, denies any headache, no fever, no chest pain or pressure, no shortness of breath , no abdominal pain. No focal weakness.    Assessment  & Plan :     1. Mechanical fall causing left hip fracture - he is status post Intramedullary left femoral nail placement by Dr. Aundria Rud on 02/05/2017, as tolerated the procedure well with minimal perioperative blood loss related anemia not requiring transfusion, per orthopedics weightbearing as tolerated, initiated PT, continue pain control, monitor H&H, discussed with Dr. Aundria Rud over the phone on 02/06/2017 Lovenox for DVT prophylaxis while he is in the hospital thereafter aspirin 81 twice a day or total of 6 weeks. Her PT patient will require SNF but he is hesitant, we'll request case management to arrange for home PT as well, continue PT here advance activity prepare for discharge tomorrow.  2. History of smoking with possible undiagnosed COPD. Counseled to quit smoking no acute issues. Follow-up with PCP.   3. Minimally elevated total bilirubin of no clear significance. 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 No right upper quadrant pain or  discomfort.  4. Mild perioperative blood loss related anemia. No transfusion, placed on oral iron monitor.   Diet : Diet regular Room service appropriate? Yes; Fluid consistency: Thin    Family Communication  : None  Code Status :  Full  Disposition Plan  :  We decided  Consults  :  Orthopedics  Procedures  :  Intramedullary left femoral nail placement by Dr. Aundria Rud on 02/05/2017  DVT Prophylaxis  :  Lovenox While in the hospital then aspirin  81 mg by mouth twice a day per Dr. Junita Push, discussed with him personally on 02/06/2017 over the phone.    Lab Results  Component Value Date   PLT 120 (L) 02/07/2017    Inpatient Medications  Scheduled Meds: . bisacodyl  5 mg Oral Daily  . docusate sodium  200 mg Oral BID  . enoxaparin (LOVENOX) injection  30 mg Subcutaneous Q24H  . ferrous sulfate  325 mg Oral TID WC  . phenazopyridine  200 mg Oral TID WC  . polyethylene glycol  17 g Oral Daily   Continuous Infusions: PRN Meds:.acetaminophen **OR** acetaminophen, fentaNYL (SUBLIMAZE) injection, HYDROcodone-acetaminophen, morphine injection, [DISCONTINUED] ondansetron **OR** ondansetron (ZOFRAN) IV, oxyCODONE  Antibiotics  :    Anti-infectives    Start     Dose/Rate Route Frequency Ordered Stop   02/05/17 2359  ceFAZolin (ANCEF) IVPB 1 g/50 mL premix     1 g 100 mL/hr over 30 Minutes Intravenous Every 6 hours 02/05/17 2100 02/06/17 1228   02/05/17 1752  ceFAZolin (ANCEF) 2-4 GM/100ML-% IVPB    Comments:  Kym Groom   : cabinet override      02/05/17 1752 02/05/17 1800         Objective:   Vitals:   02/06/17 1442 02/06/17 1827 02/06/17 2205 02/07/17 0420  BP: 129/71 125/70 126/67 (!) 98/56  Pulse: (!) 109 80 97 83  Resp: Temp: 98.4 F (36.9 C) 98.4 F (36.9 C) 98.5 F (36.9 C) 98 F (36.7 C)  TempSrc: Oral Oral Oral Oral  SpO2: 100% 100% 99% 100%  Weight:      Height:        Wt Readings from Last 3 Encounters:  02/05/17 60 kg (132 lb 4.4 oz)   02/05/17 57.2 kg (126 lb)  06/21/12 57.5 kg (126 lb 12.8 oz)     Intake/Output Summary (Last 24 hours) at 02/07/17 1001 Last data filed at 02/07/17 0845  Gross per 24 hour  Intake             1800 ml  Output             1450 ml  Net              350 ml     Physical Exam  Awake Alert, Oriented X 3, No new F.N deficits, Normal affect [place].AT,PERRAL Supple Neck,No JVD, No cervical lymphadenopathy appriciated.  Symmetrical Chest wall movement, Good air movement bilaterally, CTAB RRR,No Gallops,Rubs or new Murmurs, No Parasternal Heave +ve B.Sounds, Abd Soft, No tenderness, No organomegaly appriciated, No rebound - guarding or rigidity. No Cyanosis, Clubbing or edema, No new Rash or bruise Left hip postop site is stable with no signs of ongoing bleeding    Data Review:    CBC  Recent Labs Lab 02/05/17 1519 02/06/17 0458 02/07/17 0527  WBC 7.0 7.7 7.8  HGB 15.8 12.2* 10.2*  HCT 46.3 36.1* 29.8*  PLT 138* 130* 120*  MCV 85.9 86.4 86.9  MCH 29.3 29.2 29.7  MCHC 34.1 33.8 34.2  RDW 12.5 12.6 12.8    Chemistries   Recent Labs Lab 02/05/17 1519 02/06/17 0458 02/07/17 0527  NA 140 140 140  K 3.9 4.3 3.8  CL 103 105 105  CO2 GLUCOSE 101* 176* 105*  BUN CREATININE 0.87 0.86 0.88  CALCIUM 8.7* 7.9* 7.8*  AST ALT 19 14* 11*  ALKPHOS 65 43 43  BILITOT  2.5* 1.3* 0.6   ------------------------------------------------------------------------------------------------------------------ No results for input(s): CHOL, HDL, LDLCALC, TRIG, CHOLHDL, LDLDIRECT in the last 72 hours.  Lab Results  Component Value Date   HGBA1C 5.6 06/19/2012   ------------------------------------------------------------------------------------------------------------------ No results for input(s): TSH, T4TOTAL, T3FREE, THYROIDAB in the last 72 hours.  Invalid input(s):  FREET3 ------------------------------------------------------------------------------------------------------------------ No results for input(s): VITAMINB12, FOLATE, FERRITIN, TIBC, IRON, RETICCTPCT in the last 72 hours.  Coagulation profile  Recent Labs Lab 02/05/17 1519  INR 0.99    No results for input(s): DDIMER in the last 72 hours.  Cardiac Enzymes No results for input(s): CKMB, TROPONINI, MYOGLOBIN in the last 168 hours.  Invalid input(s): CK ------------------------------------------------------------------------------------------------------------------ No results found for: BNP  Micro Results No results found for this or any previous visit (from the past 240 hour(s)).  Radiology Reports Mr Hip Left Wo Contrast  Result Date: 02/05/2017 CLINICAL DATA:  Left hip pain since a fall off a stool this morning. Initial encounter. EXAM: MR OF THE LEFT HIP WITHOUT CONTRAST TECHNIQUE: Multiplanar, multisequence MR imaging was performed. No intravenous contrast was administered. COMPARISON:  Plain films left hip 02/05/2017 FINDINGS: Bones: The patient has an acute subcapital left hip fracture. The fracture extends into the intertrochanteric left femur. Associated marrow edema is identified. Bone marrow signal is otherwise normal. Articular cartilage and labrum Articular cartilage:  Preserved. Labrum:  Intact. Joint or bursal effusion Joint effusion:  Small left hip effusion is noted. Bursae:  Normal. Muscles and tendons Muscles and tendons:  Intact. Other findings Miscellaneous: Imaged intrapelvic contents demonstrate mild prostatomegaly. IMPRESSION: Acute subcapital left hip fracture extends into the intertrochanteric left femur. No other acute abnormality. Mild prostatomegaly. Electronically Signed   By: Drusilla Kanner M.D.   On: 02/05/2017 13:03   Dg Chest Port 1 View  Result Date: 02/05/2017 CLINICAL DATA:  Preoperative examination. EXAM: PORTABLE CHEST 1 VIEW COMPARISON:  Chest  x-ray dated June 19, 2012. FINDINGS: The cardiomediastinal silhouette is normal in size. Normal pulmonary vascularity. Hyperinflated lungs with emphysematous changes. No focal consolidation, pleural effusion, or pneumothorax. No acute osseous abnormality. IMPRESSION: COPD.  No active cardiopulmonary disease. Electronically Signed   By: Obie Dredge M.D.   On: 02/05/2017 15:49   Dg C-arm 1-60 Min-no Report  Result Date: 02/05/2017 Fluoroscopy was utilized by the requesting physician.  No radiographic interpretation.   Dg Hip Operative Unilat W Or W/o Pelvis Left  Result Date: 02/05/2017 CLINICAL DATA:  Internal fixation of left hip fracture. Initial encounter. EXAM: OPERATIVE LEFT HIP (WITH PELVIS IF PERFORMED) 2 VIEWS TECHNIQUE: Fluoroscopic spot image(s) were submitted for interpretation post-operatively. COMPARISON:  Left hip radiographs performed earlier today at 8:59 a.m. FINDINGS: Two fluoroscopic C-arm images are provided from the OR, demonstrating placement of a left hip screw transfixing the known intertrochanteric fracture in anatomic alignment. The left femoral head remains seated at the acetabulum. IMPRESSION: Status post internal fixation of left femoral intertrochanteric fracture in anatomic alignment. Electronically Signed   By: Roanna Raider M.D.   On: 02/05/2017 22:04   Dg Hip Unilat With Pelvis 2-3 Views Left  Result Date: 02/05/2017 CLINICAL DATA:  Larey Seat from stool and landed on left side. Complains of left hip pain. EXAM: DG HIP (WITH OR WITHOUT PELVIS) 2-3V LEFT COMPARISON:  None. FINDINGS: Pelvic bony ring is intact. Left hip is located without a fracture. Normal appearance of the SI joints. No significant joint space narrowing in either hip. IMPRESSION: No acute bone abnormality to the pelvis or left hip. If there is high clinical  suspicion for an occult left hip fracture, recommend further characterization with CT or MRI. Electronically Signed   By: Richarda Overlie M.D.   On:  02/05/2017 09:14    Time Spent in minutes  30   Susa Raring M.D on 02/07/2017 at 10:01 AM  Between 7am to 7pm - Pager - [phone] ( page via [email or web address], text pages only, please mention full 10 digit call back number). After 7pm go to [email or web address] - password [email or web address]

## 2017-02-07 NOTE — NC FL2 (Signed)
[place] MEDICAID FL2 LEVEL OF CARE SCREENING TOOL     IDENTIFICATION  Patient Name: Mark Hansen Birthdate: [birth year] Sex: male Admission Date (Current Location): 02/05/2017  [facility] and [place] Number:  Producer, television/film/video and Address:  [facility], [address], [place], [place] [address]      Provider Number: [id number]  Attending Physician Name and Address:  Leroy Sea, MD  Relative Name and Phone Number:       Current Level of Care: Hospital Recommended Level of Care: Skilled Nursing Facility Prior Approval Number:    Date Approved/Denied:   PASRR Number:    Discharge Plan: SNF    Current Diagnoses: Patient Active Problem List   Diagnosis Date Noted  . Hip fracture (HCC) 02/05/2017  . Elevated bilirubin 02/05/2017  . Thrombocytopenia (HCC) 02/05/2017  . Chest pain 06/19/2012  . Anxiety   . GERD (gastroesophageal reflux disease)   . Peripheral vascular disease (HCC)   . Prostate disease     Orientation RESPIRATION BLADDER Height & Weight     Self, Time, Situation, Place  O2 (1L [place]) Continent Weight: 132 lb 4.4 oz (60 kg) Height:   (167.6 cm)  BEHAVIORAL SYMPTOMS/MOOD NEUROLOGICAL BOWEL NUTRITION STATUS      Continent Diet (regular)  AMBULATORY STATUS COMMUNICATION OF NEEDS Skin   Limited Assist Verbally Surgical wounds (on hip- silver hydrofiber)                       Personal Care Assistance Level of Assistance  Bathing, Dressing Bathing Assistance: Limited assistance   Dressing Assistance: Limited assistance     Functional Limitations Info             SPECIAL CARE FACTORS FREQUENCY  PT (By licensed PT), OT (By licensed OT)     PT Frequency: 5/wk OT Frequency: 5/wk            Contractures      Additional Factors Info  Code Status, Allergies Code Status Info: FULL Allergies Info: NKA           Current Medications (02/07/2017):  This is the current hospital active medication  list Current Facility-Administered Medications  Medication Dose Route Frequency Provider Last Rate Last Dose  . acetaminophen (TYLENOL) tablet 650 mg  650 mg Oral Q6H PRN Yolonda Kida, MD       Or  . acetaminophen (TYLENOL) suppository 650 mg  650 mg Rectal Q6H PRN Yolonda Kida, MD      . bisacodyl (DULCOLAX) EC tablet 5 mg  5 mg Oral Daily Leroy Sea, MD   5 mg at 02/07/17 0857  . docusate sodium (COLACE) capsule 200 mg  200 mg Oral BID Leroy Sea, MD   200 mg at 02/07/17 0857  . enoxaparin (LOVENOX) injection 30 mg  30 mg Subcutaneous Q24H Yolonda Kida, MD   30 mg at 02/07/17 0858  . fentaNYL (SUBLIMAZE) injection 100 mcg  100 mcg Intravenous Q30 min PRN Mancel Bale, MD   100 mcg at 02/07/17 0406  . ferrous sulfate tablet 325 mg  325 mg Oral TID WC Leroy Sea, MD   325 mg at 02/07/17 1220  . HYDROcodone-acetaminophen (NORCO/VICODIN) 5-325 MG per tablet 1 tablet  1 tablet Oral Q6H PRN Leroy Sea, MD   1 tablet at 02/07/17 0904  . ondansetron (ZOFRAN) injection 4 mg  4 mg Intravenous Q6H PRN Yolonda Kida, MD  4 mg at 02/07/17 0905  . oxyCODONE (Oxy IR/ROXICODONE) immediate release tablet 5 mg  5 mg Oral Q4H PRN Zigmund Daniel., MD   5 mg at 02/06/17 1340  . phenazopyridine (PYRIDIUM) tablet 200 mg  200 mg Oral TID WC Opyd, Lavone Neri, MD   200 mg at 02/07/17 1220  . polyethylene glycol (MIRALAX / GLYCOLAX) packet 17 g  17 g Oral Daily Leroy Sea, MD   17 g at 02/07/17 1007     Discharge Medications: Please see discharge summary for a list of discharge medications.  Relevant Imaging Results:  Relevant Lab Results:   Additional Information SS#:   Burna Sis, LCSW

## 2017-02-07 NOTE — Clinical Social Work Note (Signed)
Clinical Social Work Assessment  Patient Details  Name: Mark Hansen MRN: [id number] Date of Birth: Sep 17, 1945  Date of referral:  02/07/17               Reason for consult:  Facility Placement                Permission sought to share information with:  Facility Industrial/product designer granted to share information::  Yes, Verbal Permission Granted  Name::        Agency::  SNF  Relationship::     Contact Information:     Housing/Transportation Living arrangements for the past 2 months:  Skilled Nursing Facility Source of Information:  Patient Patient Interpreter Needed:  None Criminal Activity/Legal Involvement Pertinent to Current Situation/Hospitalization:  No - Comment as needed Significant Relationships:  Adult Children Lives with:  Adult Children Do you feel safe going back to the place where you live?  No Need for family participation in patient care:  No (Coment)  Care giving concerns:  Pt lives at home with children who work during the day- medical team and pt family does not think this is safe for pt to be alone for long periods of time.   Social Worker assessment / plan:  CSW spoke with pt concerning PT recommendation for SNF.  CSW explained SNF and SNF referral process.  Employment status:  Retired Database administrator PT Recommendations:  Skilled Nursing Facility Information / Referral to community resources:  Skilled Nursing Facility  Patient/Family's Response to care:  Pt agreeable to SNF because he feels as if his family and the physicians are not giving him a choice.  Pt would rather go home but not willing to go against his families wishes at this time.  Patient/Family's Understanding of and Emotional Response to Diagnosis, Current Treatment, and Prognosis:  Unclear level of understanding at this time- hopeful that rehab will be very short and that he can return home soon.  Emotional Assessment Appearance:  Appears stated  age Attitude/Demeanor/Rapport:    Affect (typically observed):  Appropriate, Pleasant Orientation:  Oriented to Self, Oriented to Place, Oriented to  Time, Oriented to Situation Alcohol / Substance use:  Not Applicable Psych involvement (Current and /or in the community):  No (Comment)  Discharge Needs  Concerns to be addressed:  Care Coordination Readmission within the last 30 days:  No Current discharge risk:  Physical Impairment Barriers to Discharge:  Continued Medical Work up   Burna Sis, LCSW 02/07/2017, 3:42 PM

## 2017-02-08 ENCOUNTER — Encounter (HOSPITAL_COMMUNITY): Payer: Self-pay | Admitting: Orthopedic Surgery

## 2017-02-08 LAB — HEMOGLOBIN AND HEMATOCRIT, BLOOD
HEMATOCRIT: 29.4 % — AB (ref 39.0–52.0)
Hemoglobin: 10.3 g/dL — ABNORMAL LOW (ref 13.0–17.0)

## 2017-02-08 MED ORDER — IBUPROFEN 400 MG PO TABS: 400.0000 mg | ORAL_TABLET | Freq: Three times a day (TID) | ORAL | Status: DC | PRN

## 2017-02-08 MED ORDER — BISACODYL 5 MG PO TBEC: 5.0000 mg | DELAYED_RELEASE_TABLET | Freq: Every day | ORAL | 0 refills | Status: DC

## 2017-02-08 MED ORDER — FERROUS SULFATE 325 (65 FE) MG PO TABS: 325.0000 mg | ORAL_TABLET | Freq: Two times a day (BID) | ORAL | Status: DC

## 2017-02-08 MED ORDER — POLYETHYLENE GLYCOL 3350 17 G PO PACK: 17.0000 g | PACK | Freq: Every day | ORAL | 0 refills | Status: DC

## 2017-02-08 MED ORDER — PANTOPRAZOLE SODIUM 40 MG PO TBEC: 40.0000 mg | DELAYED_RELEASE_TABLET | Freq: Every day | ORAL | 1 refills | Status: DC

## 2017-02-08 NOTE — Discharge Summary (Signed)
Mark Hansen XWR:[id number] DOB: December 29, 1945 DOA: 02/05/2017  PCP: Abelina Bachelor, MD  Admit date: 02/05/2017  Discharge date: 02/08/2017  Admitted From: Home   Disposition:  SNF   Recommendations for Outpatient Follow-up:   Follow up with PCP in 1-2 weeks  PCP Please obtain BMP/CBC, 2 view CXR in 1week,  (see Discharge instructions)   PCP Please follow up on the following pending results:    Home Health: None   Equipment/Devices: None  Consultations: Ortho - Dr Aundria Rud Discharge Condition: Stable   CODE STATUS: Full   Diet Recommendation:  Heart Healthy    Chief Complaint  Patient presents with  . Fall  . Hip Pain    left side     Brief history of present illness from the day of admission and additional interim summary     Mark Hansen a 71 y.o.malewith chart history of GERD, PVD, BPH presenting after a mechanical fall with left hip fracture                                                                 Hospital Course    1. Mechanical fall causing left hip fracture - he is status post Intramedullary left femoral nail placement by Dr. Aundria Rud on 02/05/2017, has tolerated the procedure well with minimal perioperative blood loss related anemia not requiring transfusion, per orthopedics weightbearing as tolerated on the Left hip/leg, continuePT, continue pain control, discussed with Dr. Aundria Rud over the phone on 02/06/2017 Lovenox for DVT prophylaxis while he is in the hospital thereafter aspirin 81 twice a day or total of 6 weeks. DC to SNF.  2. History of smoking with possible undiagnosed COPD. Counseled to quit smoking no acute issues. Follow-up with PCP.  3. Minimally elevated total bilirubin of no clear significance. Could have Gilbert's syndrome, trend is improved, repeat CMP in the  outpatient setting by PCP in 3-4 weeks. No right upper quadrant pain or discomfort.  4. Mild perioperative blood loss related anemia. No transfusion, placed on oral iron monitor at SNF.   Discharge diagnosis     Active Problems:   Hip fracture (HCC)   Elevated bilirubin   Thrombocytopenia (HCC)    Discharge instructions    Discharge Instructions    Diet - low sodium heart healthy    Complete by:  As directed    Discharge instructions    Complete by:  As directed    Orthopedic discharge instructions:  - Okay for full weightbearing as tolerated to the left leg .- Maintain your postoperative dressing for 2 weeks until he follow-up with her surgeon. It is okay to shower without bandage in place. If it become saturated U may remove it and replace it with a clean dry dressing once daily. - For prevention of blood clots take one 81  mg aspirin twice daily for 6 weeks. - Return to see Dr. Aundria Rud in 2 weeks for postoperative follow-up.  Follow with Primary MD Abelina Bachelor, MD in 7 days   Get CBC, BMP checked  By  SNF MD in 5-7 days ( we routinely change or add medications that can affect your baseline labs and fluid status, therefore we recommend that you get the mentioned basic workup next visit with your PCP, your PCP may decide not to get them or add new tests based on their clinical decision)  Activity: L.Hip weight bearing as tolerated with Full fall precautions use walker/cane & assistance as needed  Disposition SNF  Diet:  Heart Healthy .  For Heart failure patients - Check your Weight same time everyday, if you gain over 2 pounds, or you develop in leg swelling, experience more shortness of breath or chest pain, call your Primary MD immediately. Follow Cardiac Low Salt Diet and 1.5 lit/day fluid restriction.  On your next visit with your primary care physician please Get Medicines reviewed and adjusted.  Please request your Prim.MD to go over all Hospital Tests and  Procedure/Radiological results at the follow up, please get all Hospital records sent to your Prim MD by signing hospital release before you go home.  If you experience worsening of your admission symptoms, develop shortness of breath, life threatening emergency, suicidal or homicidal thoughts you must seek medical attention immediately by calling 911 or calling your MD immediately  if symptoms less severe.  You Must read complete instructions/literature along with all the possible adverse reactions/side effects for all the Medicines you take and that have been prescribed to you. Take any new Medicines after you have completely understood and accpet all the possible adverse reactions/side effects.   Do not drive, operate heavy machinery, perform activities at heights, swimming or participation in water activities or provide baby sitting services if your were admitted for syncope or siezures until you have seen by Primary MD or a Neurologist and advised to do so again.  Do not drive when taking Pain medications.    Do not take more than prescribed Pain, Sleep and Anxiety Medications  Special Instructions: If you have smoked or chewed Tobacco  in the last 2 yrs please stop smoking, stop any regular Alcohol  and or any Recreational drug use.  Wear Seat belts while driving.   Please note  You were cared for by a hospitalist during your hospital stay. If you have any questions about your discharge medications or the care you received while you were in the hospital after you are discharged, you can call the unit and asked to speak with the hospitalist on call if the hospitalist that took care of you is not available. Once you are discharged, your primary care physician will handle any further medical issues. Please note that NO REFILLS for any discharge medications will be authorized once you are discharged, as it is imperative that you return to your primary care physician (or establish a relationship  with a primary care physician if you do not have one) for your aftercare needs so that they can reassess your need for medications and monitor your lab values.      Discharge Medications   Allergies as of 02/08/2017   No Known Allergies     Medication List    TAKE these medications   aspirin EC 81 MG tablet Take 1 tablet (81 mg total) by mouth 2 (two) times daily. What changed:  when to take this   bisacodyl 5 MG EC tablet Commonly known as:  DULCOLAX Take 1 tablet (5 mg total) by mouth daily.   ferrous sulfate 325 (65 FE) MG tablet Take 1 tablet (325 mg total) by mouth 2 (two) times daily with a meal.   HYDROcodone-acetaminophen 5-325 MG tablet Commonly known as:  NORCO Take 1-2 tablets by mouth every 6 (six) hours as needed for moderate pain.   ibuprofen 400 MG tablet Commonly known as:  ADVIL,MOTRIN Take 1 tablet (400 mg total) by mouth every 8 (eight) hours as needed for moderate pain. What changed:  medication strength  how much to take  when to take this   pantoprazole 40 MG tablet Commonly known as:  PROTONIX Take 1 tablet (40 mg total) by mouth daily.   polyethylene glycol packet Commonly known as:  MIRALAX / GLYCOLAX Take 17 g by mouth daily.   vitamin E 1000 UNIT capsule Take 1,000 Units by mouth daily.       Follow-up Information    Yolonda Kida, MD. Schedule an appointment as soon as possible for a visit in 2 week(s).   Specialty:  Orthopedic Surgery Why:  For wound re-check Contact information: [address] [place] [address] [phone]        Abelina Bachelor, MD. Schedule an appointment as soon as possible for a visit in 1 week(s).   Specialty:  Family Medicine Contact information: [address] [place] [address] [phone]           Major procedures and Radiology Reports - PLEASE review detailed and final reports thoroughly  -         Mr Hip Left Wo Contrast  Result  Date: 02/05/2017 CLINICAL DATA:  Left hip pain since a fall off a stool this morning. Initial encounter. EXAM: MR OF THE LEFT HIP WITHOUT CONTRAST TECHNIQUE: Multiplanar, multisequence MR imaging was performed. No intravenous contrast was administered. COMPARISON:  Plain films left hip 02/05/2017 FINDINGS: Bones: The patient has an acute subcapital left hip fracture. The fracture extends into the intertrochanteric left femur. Associated marrow edema is identified. Bone marrow signal is otherwise normal. Articular cartilage and labrum Articular cartilage:  Preserved. Labrum:  Intact. Joint or bursal effusion Joint effusion:  Small left hip effusion is noted. Bursae:  Normal. Muscles and tendons Muscles and tendons:  Intact. Other findings Miscellaneous: Imaged intrapelvic contents demonstrate mild prostatomegaly. IMPRESSION: Acute subcapital left hip fracture extends into the intertrochanteric left femur. No other acute abnormality. Mild prostatomegaly. Electronically Signed   By: Drusilla Kanner M.D.   On: 02/05/2017 13:03   Dg Chest Port 1 View  Result Date: 02/05/2017 CLINICAL DATA:  Preoperative examination. EXAM: PORTABLE CHEST 1 VIEW COMPARISON:  Chest x-ray dated June 19, 2012. FINDINGS: The cardiomediastinal silhouette is normal in size. Normal pulmonary vascularity. Hyperinflated lungs with emphysematous changes. No focal consolidation, pleural effusion, or pneumothorax. No acute osseous abnormality. IMPRESSION: COPD.  No active cardiopulmonary disease. Electronically Signed   By: Obie Dredge M.D.   On: 02/05/2017 15:49   Dg C-arm 1-60 Min-no Report  Result Date: 02/05/2017 Fluoroscopy was utilized by the requesting physician.  No radiographic interpretation.   Dg Hip Operative Unilat W Or W/o Pelvis Left  Result Date: 02/05/2017 CLINICAL DATA:  Internal fixation of left hip fracture. Initial encounter. EXAM: OPERATIVE LEFT HIP (WITH PELVIS IF PERFORMED) 2 VIEWS TECHNIQUE:  Fluoroscopic spot image(s) were submitted for interpretation post-operatively. COMPARISON:  Left hip  radiographs performed earlier today at 8:59 a.m. FINDINGS: Two fluoroscopic C-arm images are provided from the OR, demonstrating placement of a left hip screw transfixing the known intertrochanteric fracture in anatomic alignment. The left femoral head remains seated at the acetabulum. IMPRESSION: Status post internal fixation of left femoral intertrochanteric fracture in anatomic alignment. Electronically Signed   By: Roanna Raider M.D.   On: 02/05/2017 22:04   Dg Hip Unilat With Pelvis 2-3 Views Left  Result Date: 02/05/2017 CLINICAL DATA:  Larey Seat from stool and landed on left side. Complains of left hip pain. EXAM: DG HIP (WITH OR WITHOUT PELVIS) 2-3V LEFT COMPARISON:  None. FINDINGS: Pelvic bony ring is intact. Left hip is located without a fracture. Normal appearance of the SI joints. No significant joint space narrowing in either hip. IMPRESSION: No acute bone abnormality to the pelvis or left hip. If there is high clinical suspicion for an occult left hip fracture, recommend further characterization with CT or MRI. Electronically Signed   By: Richarda Overlie M.D.   On: 02/05/2017 09:14    Micro Results    No results found for this or any previous visit (from the past 240 hour(s)).  Today   Subjective    Mark Hansen today has no headache,no chest abdominal pain,no new weakness tingling or numbness, feels much better wants to go to SNF today.     Objective   Blood pressure 124/65, pulse 85, temperature 98.3 F (36.8 C), temperature source Oral, resp. rate 17, height  (1.676 m), weight 60 kg (132 lb 4.4 oz), SpO2 97 %.   Intake/Output Summary (Last 24 hours) at 02/08/17 0942 Last data filed at 02/08/17 0720  Gross per 24 hour  Intake              460 ml  Output             1651 ml  Net            -1191 ml    Exam Awake Alert, Oriented x 3, No new F.N deficits, Normal  affect [place].AT,PERRAL Supple Neck,No JVD, No cervical lymphadenopathy appriciated.  Symmetrical Chest wall movement, Good air movement bilaterally, CTAB RRR,No Gallops,Rubs or new Murmurs, No Parasternal Heave +ve B.Sounds, Abd Soft, Non tender, No organomegaly appriciated, No rebound -guarding or rigidity. No Cyanosis, Clubbing or edema, No new Rash or bruise, left hip postop site appears stable   Data Review   CBC w Diff: Lab Results  Component Value Date   WBC 7.8 02/07/2017   HGB 10.3 (L) 02/08/2017   HCT 29.4 (L) 02/08/2017   PLT 120 (L) 02/07/2017   LYMPHOPCT 26 06/19/2012   MONOPCT 7 06/19/2012   EOSPCT 3 06/19/2012   BASOPCT 0 06/19/2012    CMP: Lab Results  Component Value Date   NA 140 02/07/2017   K 3.8 02/07/2017   CL 105 02/07/2017   CO2 31 02/07/2017   BUN 14 02/07/2017   CREATININE 0.88 02/07/2017   PROT 4.5 (L) 02/07/2017   ALBUMIN 2.7 (L) 02/07/2017   BILITOT 0.6 02/07/2017   ALKPHOS 43 02/07/2017   AST 18 02/07/2017   ALT 11 (L) 02/07/2017  .   Total Time in preparing paper work, data evaluation and todays exam - 35 minutes  Susa Raring M.D on 02/08/2017 at 9:42 AM  [facility]   Office  [phone]

## 2017-02-08 NOTE — Clinical Social Work Placement (Addendum)
Authorization received.  Patient sons agreeable for the patient to transport by [facility].  [facility] called for transport.   CLINICAL SOCIAL WORK PLACEMENT  NOTE  Date:  02/08/2017  Patient Details  Name: Mark Hansen MRN: [id number] Date of Birth: 10-14-1945  Clinical Social Work is seeking post-discharge placement for this patient at the Skilled  Nursing Facility level of care (*CSW will initial, date and re-position this form in  chart as items are completed):  Yes   Patient/family provided with [facility] Clinical Social Work Department's list of facilities offering this level of care within the geographic area requested by the patient (or if unable, by the patient's family).  Yes   Patient/family informed of their freedom to choose among providers that offer the needed level of care, that participate in Medicare, Medicaid or managed care program needed by the patient, have an available bed and are willing to accept the patient.  Yes   Patient/family informed of [facility]'s ownership interest in [facility] and [facility], as well as of the fact that they are under no obligation to receive care at these facilities.  PASRR submitted to EDS on 02/08/17     PASRR number received on 02/08/17     Existing PASRR number confirmed on       FL2 transmitted to all facilities in geographic area requested by pt/family on       FL2 transmitted to all facilities within larger geographic area on 02/07/17     Patient informed that his/her managed care company has contracts with or will negotiate with certain facilities, including the following:  [facility]     Yes   Patient/family informed of bed offers received.  Patient chooses bed at [facility]     Physician recommends and patient chooses bed at      Patient to be transferred to [facility] on 02/08/17.  Patient to be transferred to facility by [facility]     Patient family notified on  02/08/17 of transfer.  Name of family member notified:  Son-Jonathan/Jonah     PHYSICIAN Please sign FL2     Additional Comment:    _______________________________________________ Clearance Coots, LCSW 02/08/2017, 1:11 PM

## 2017-02-08 NOTE — Progress Notes (Signed)
Patient was discharged to SNF.

## 2017-02-08 NOTE — Progress Notes (Signed)
Physical Therapy Treatment Patient Details Name: Mark Hansen MRN: [id number] DOB: 04/26/1946 Today's Date: 02/08/2017    History of Present Illness INTRAMEDULLARY (IM) NAIL FEMORAL LEFT (Left)    PT Comments    Pt progressing well; ok to travel by car to facility    Follow Up Recommendations  SNF     Equipment Recommendations  Rolling walker with 5" wheels    Recommendations for Other Services       Precautions / Restrictions Precautions Precautions: Fall Restrictions LLE Weight Bearing: Weight bearing as tolerated    Mobility  Bed Mobility Overal bed mobility: Needs Assistance Bed Mobility: Supine to Sit     Supine to sit: Supervision     General bed mobility comments: for safety  Transfers Overall transfer level: Needs assistance Equipment used: Rolling walker (2 wheeled) Transfers: Sit to/from Stand Sit to Stand: Min guard         General transfer comment: cues for hand placement and overall safety  Ambulation/Gait Ambulation/Gait assistance: Min assist;Min guard Ambulation Distance (Feet): 200 Feet Assistive device: Rolling walker (2 wheeled) Gait Pattern/deviations: Step-through pattern     General Gait Details: cues for safety awareness with turns   Careers information officer    Modified Rankin (Stroke Patients Only)       Balance                                            Cognition Arousal/Alertness: Awake/alert Behavior During Therapy: WFL for tasks assessed/performed Overall Cognitive Status: Within Functional Limits for tasks assessed                                        Exercises      General Comments        Pertinent Vitals/Pain Pain Assessment: 0-10 Pain Score: 1  Pain Location: L hip Pain Descriptors / Indicators: Discomfort;Sore Pain Intervention(s): Monitored during session    Home Living                      Prior Function            PT  Goals (current goals can now be found in the care plan section) Acute Rehab PT Goals Patient Stated Goal: home with daugther PT Goal Formulation: With patient Time For Goal Achievement: 02/20/17 Potential to Achieve Goals: Good Progress towards PT goals: Progressing toward goals    Frequency    Min 3X/week      PT Plan Current plan remains appropriate;Frequency needs to be updated    Co-evaluation              AM-PAC PT "6 Clicks" Daily Activity  Outcome Measure  Difficulty turning over in bed (including adjusting bedclothes, sheets and blankets)?: A Little Difficulty moving from lying on back to sitting on the side of the bed? : A Little Difficulty sitting down on and standing up from a chair with arms (e.g., wheelchair, bedside commode, etc,.)?: A Little Help needed moving to and from a bed to chair (including a wheelchair)?: A Little Help needed walking in hospital room?: A Little Help needed climbing 3-5 steps with a railing? : A Little 6 Click Score: 18    End  of Session Equipment Utilized During Treatment: Gait belt Activity Tolerance: Patient tolerated treatment well Patient left: in chair;with call bell/phone within reach   PT Visit Diagnosis: Unsteadiness on feet (R26.81);Repeated falls (R29.6);Muscle weakness (generalized) (M62.81);Difficulty in walking, not elsewhere classified (R26.2);Pain Pain - Right/Left: Left Pain - part of body: Hip     Time: 1610-9604 PT Time Calculation (min) (ACUTE ONLY): 24 min  Charges:  $Gait Training: 23-37 mins                    G CodesDrucilla Chalet, PT Pager: [phone] 02/08/2017    Drucilla Chalet 02/08/2017, 1:38 PM

## 2017-02-08 NOTE — Discharge Instructions (Signed)
Orthopedic discharge instructions:  - Okay for full weightbearing as tolerated to the left leg .- Maintain your postoperative dressing for 2 weeks until he follow-up with her surgeon. It is okay to shower without bandage in place. If it become saturated U may remove it and replace it with a clean dry dressing once daily. - For prevention of blood clots take one 81 mg aspirin twice daily for 6 weeks. - Return to see Dr. Aundria Rud in 2 weeks for postoperative follow-up.  Follow with Primary MD Abelina Bachelor, MD in 7 days   Get CBC, BMP checked  By  SNF MD in 5-7 days ( we routinely change or add medications that can affect your baseline labs and fluid status, therefore we recommend that you get the mentioned basic workup next visit with your PCP, your PCP may decide not to get them or add new tests based on their clinical decision)  Activity: L.Hip weight bearing as tolerated with Full fall precautions use walker/cane & assistance as needed  Disposition SNF  Diet:  Heart Healthy .  For Heart failure patients - Check your Weight same time everyday, if you gain over 2 pounds, or you develop in leg swelling, experience more shortness of breath or chest pain, call your Primary MD immediately. Follow Cardiac Low Salt Diet and 1.5 lit/day fluid restriction.  On your next visit with your primary care physician please Get Medicines reviewed and adjusted.  Please request your Prim.MD to go over all Hospital Tests and Procedure/Radiological results at the follow up, please get all Hospital records sent to your Prim MD by signing hospital release before you go home.  If you experience worsening of your admission symptoms, develop shortness of breath, life threatening emergency, suicidal or homicidal thoughts you must seek medical attention immediately by calling 911 or calling your MD immediately  if symptoms less severe.  You Must read complete instructions/literature along with all the possible adverse  reactions/side effects for all the Medicines you take and that have been prescribed to you. Take any new Medicines after you have completely understood and accpet all the possible adverse reactions/side effects.   Do not drive, operate heavy machinery, perform activities at heights, swimming or participation in water activities or provide baby sitting services if your were admitted for syncope or siezures until you have seen by Primary MD or a Neurologist and advised to do so again.  Do not drive when taking Pain medications.    Do not take more than prescribed Pain, Sleep and Anxiety Medications  Special Instructions: If you have smoked or chewed Tobacco  in the last 2 yrs please stop smoking, stop any regular Alcohol  and or any Recreational drug use.  Wear Seat belts while driving.   Please note  You were cared for by a hospitalist during your hospital stay. If you have any questions about your discharge medications or the care you received while you were in the hospital after you are discharged, you can call the unit and asked to speak with the hospitalist on call if the hospitalist that took care of you is not available. Once you are discharged, your primary care physician will handle any further medical issues. Please note that NO REFILLS for any discharge medications will be authorized once you are discharged, as it is imperative that you return to your primary care physician (or establish a relationship with a primary care physician if you do not have one) for your aftercare needs so that  they can reassess your need for medications and monitor your lab values.

## 2017-02-09 ENCOUNTER — Encounter: Payer: Self-pay | Admitting: Internal Medicine

## 2017-02-09 ENCOUNTER — Non-Acute Institutional Stay (SKILLED_NURSING_FACILITY): Payer: Medicare (Managed Care) | Admitting: Internal Medicine

## 2017-02-09 DIAGNOSIS — K219 Gastro-esophageal reflux disease without esophagitis: Secondary | ICD-10-CM | POA: Diagnosis not present

## 2017-02-09 DIAGNOSIS — M545 Low back pain: Secondary | ICD-10-CM | POA: Diagnosis not present

## 2017-02-09 DIAGNOSIS — I739 Peripheral vascular disease, unspecified: Secondary | ICD-10-CM

## 2017-02-09 DIAGNOSIS — D62 Acute posthemorrhagic anemia: Secondary | ICD-10-CM

## 2017-02-09 DIAGNOSIS — S72002D Fracture of unspecified part of neck of left femur, subsequent encounter for closed fracture with routine healing: Secondary | ICD-10-CM

## 2017-02-09 DIAGNOSIS — G8929 Other chronic pain: Secondary | ICD-10-CM | POA: Diagnosis not present

## 2017-02-09 DIAGNOSIS — R17 Unspecified jaundice: Secondary | ICD-10-CM

## 2017-02-09 DIAGNOSIS — D696 Thrombocytopenia, unspecified: Secondary | ICD-10-CM

## 2017-02-09 NOTE — Progress Notes (Signed)
: Provider:  Randon Goldsmith. Lyn Hollingshead, MD Location:  [facility] Room Number: 513 Place of Service:  SNF ([phone])  PCP: Abelina Bachelor, MD Patient Care Team: Abelina Bachelor, MD as PCP - General (Family Medicine) Yolonda Kida, MD as Consulting Physician (Orthopedic Surgery)  Extended Emergency Contact Information Primary Emergency Contact: Audrie Gallus [place] Home Phone: [phone] Mobile Phone: [phone] Relation: Daughter     Allergies: Patient has no known allergies.  Chief Complaint  Patient presents with  . New Admit To SNF    following hospitalization 02/05/17 to 02/08/17 fall, acute subcapital left hip fracture extends into the intertrochanteric left femur.     HPI: Patient is 71 y.o. male with GERD, PVD, BPH, presented to [facility] ED after a fall. Patient was standing at a bar stool to stop the smoke detector from BP and drop something, the stool slipped and he fell to the ground hitting his left hip. He is not any head trauma. From their crawl to his bed. He was able to stand and walk with a limp after that but after going to bed had a very difficult time getting up to use the restroom which is the reason he came to the emergency department. He jogs weekly and he lives at home in ED plain films followed by an MRI which showed his acute subcapital left hip fracture extending into the intertrochanteric left femur. Patient was admitted to the hospital from 10/12-15 for repair of his hip fracture. Surgery with some minimal postoperative anemia not requiring a transfusion and aspirin to be used as DVT prophylaxis. Patient is admitted to skilled nursing facility for OT/PT. Patient with very few medical problems. At skilled nursing facility patient will be followed for peripheral vascular disease which is treated with aspirin and back pain treated with ibuprofen and GERD for which patient was not taking any  medications.  Past Medical History:  Diagnosis Date  . Anxiety   . Back pain   . GERD (gastroesophageal reflux disease)   . Hip fracture (HCC) 02/05/2017   left  . Peripheral vascular disease (HCC)   . Prostate disease   . Thrombocytopenia (HCC) 02/05/2017    Past Surgical History:  Procedure Laterality Date  . ANKLE SURGERY     bilateral ankle surgery  . COLONOSCOPY  01/07/2011  . FEMUR IM NAIL Left 02/05/2017   Procedure: INTRAMEDULLARY (IM) NAIL FEMORAL LEFT;  Surgeon: Yolonda Kida, MD;  Location: [facility]S;  Service: Orthopedics;  Laterality: Left;  . OTHER SURGICAL HISTORY     unspecified type of prostate surgery    Allergies as of 02/09/2017   No Known Allergies     Medication List       Accurate as of 02/09/17  1:54 PM. Always use your most recent med list.          aspirin EC 81 MG tablet Take 1 tablet (81 mg total) by mouth 2 (two) times daily.   bisacodyl 5 MG EC tablet Commonly known as:  DULCOLAX Take 1 tablet (5 mg total) by mouth daily.   ferrous sulfate 325 (65 FE) MG tablet Take 1 tablet (325 mg total) by mouth 2 (two) times daily with a meal.   HYDROcodone-acetaminophen 5-325 MG tablet Commonly known as:  NORCO/VICODIN Take 1 tablet by mouth. Take one table every 6 hours as needed for mild to moderate pain for 2 weeks. Stop 02/22/17   ibuprofen 400 MG tablet  Commonly known as:  ADVIL,MOTRIN Take 1 tablet (400 mg total) by mouth every 8 (eight) hours as needed for moderate pain.   pantoprazole 40 MG tablet Commonly known as:  PROTONIX Take 1 tablet (40 mg total) by mouth daily.   polyethylene glycol packet Commonly known as:  MIRALAX / GLYCOLAX Take 17 g by mouth daily.   vitamin E 1000 UNIT capsule Take 1,000 Units by mouth daily.       Meds ordered this encounter  Medications  . HYDROcodone-acetaminophen (NORCO/VICODIN) 5-325 MG tablet    Sig: Take 1 tablet by mouth. Take one table every 6 hours as needed for mild to  moderate pain for 2 weeks. Stop 02/22/17    Immunization History  Administered Date(s) Administered  . Pneumococcal-Unspecified 04/27/2010  . Tdap 04/27/1964    Social History  Substance Use Topics  . Smoking status: Former Smoker    Packs/day: 1.00    Years: 35.00    Types: Cigarettes  . Smokeless tobacco: Never Used  . 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    Family history is   Family History  Problem Relation Age of Onset  . CAD Brother 50       requiring bypass  . CAD Mother 36  . Asthma Mother   . Alzheimer's disease Father   . Emphysema Father   . Kidney disease Unknown        cousin      Review of Systems  DATA OBTAINED: from patient GENERAL:  no fevers, fatigue, appetite changes SKIN: No itching, or rash EYES: No eye pain, redness, discharge EARS: No earache, tinnitus, change in hearing NOSE: No congestion, drainage or bleeding  MOUTH/THROAT: No mouth or tooth pain, No sore throat RESPIRATORY: No cough, wheezing, SOB CARDIAC: No chest pain, palpitations, lower extremity edema  GI: No abdominal pain, No N/V/D or constipation, No heartburn or reflux  GU: No dysuria, frequency or urgency, or incontinence  MUSCULOSKELETAL: No unrelieved bone/joint pain NEUROLOGIC: No headache, dizziness or focal weakness PSYCHIATRIC: No c/o anxiety or sadness   Vitals:   02/09/17 1344  BP: 126/63  Pulse: 93  Resp: 16  Temp: 98.9 F (37.2 C)    SpO2 Readings from Last 1 Encounters:  02/08/17 94%   Body mass index is 22.2 kg/m.     Physical Exam  GENERAL APPEARANCE: Alert, conversant,  No acute distress, Very nice gentleman from [place]  SKIN: No unusual heat tenderness or swelling to incision site HEAD: Normocephalic, atraumatic  EYES: Conjunctiva/lids clear. Pupils round, reactive. EOMs intact.  EARS: External exam WNL, canals clear. Hearing grossly normal.  NOSE: No deformity or discharge.  MOUTH/THROAT: Lips w/o  lesions  RESPIRATORY: Breathing is even, unlabored. Lung sounds are clear   CARDIOVASCULAR: Heart RRR no murmurs, rubs or gallops. No peripheral edema.   GASTROINTESTINAL: Abdomen is soft, non-tender, not distended w/ normal bowel sounds. GENITOURINARY: Bladder non tender, not distended  MUSCULOSKELETAL: No abnormal joints or musculature NEUROLOGIC:  Cranial nerves 2-12 grossly intact. Moves all extremities  PSYCHIATRIC: Mood and affect appropriate to situation, no behavioral issues  Patient Active Problem List   Diagnosis Date Noted  . Hip fracture (HCC) 02/05/2017  . Elevated bilirubin 02/05/2017  . Thrombocytopenia (HCC) 02/05/2017  . Chest pain 06/19/2012  . Anxiety   . GERD (gastroesophageal reflux disease)   . Peripheral vascular disease (HCC)   . Prostate disease  Labs reviewed: Basic Metabolic Panel:    Component Value Date/Time   NA 140 02/07/2017 0527   K 3.8 02/07/2017 0527   CL 105 02/07/2017 0527   CO2 31 02/07/2017 0527   GLUCOSE 105 (H) 02/07/2017 0527   BUN 14 02/07/2017 0527   CREATININE 0.88 02/07/2017 0527   CALCIUM 7.8 (L) 02/07/2017 0527   PROT 4.5 (L) 02/07/2017 0527   ALBUMIN 2.7 (L) 02/07/2017 0527   AST 18 02/07/2017 0527   ALT 11 (L) 02/07/2017 0527   ALKPHOS 43 02/07/2017 0527   BILITOT 0.6 02/07/2017 0527   GFRNONAA >60 02/07/2017 0527   GFRAA >60 02/07/2017 0527     Recent Labs  02/05/17 1519 02/06/17 0458 02/07/17 0527  NA 140 140 140  K 3.9 4.3 3.8  CL 103 105 105  CO2 GLUCOSE 101* 176* 105*  BUN CREATININE 0.87 0.86 0.88  CALCIUM 8.7* 7.9* 7.8*   Liver Function Tests:  Recent Labs  02/05/17 1519 02/06/17 0458 02/07/17 0527  AST ALT 19 14* 11*  ALKPHOS 65 43 43  BILITOT 2.5* 1.3* 0.6  PROT 6.4* 5.0* 4.5*  ALBUMIN 4.2 2.9* 2.7*   No results for input(s): LIPASE, AMYLASE in the last 8760 hours. No results for input(s): AMMONIA in the last 8760 hours. CBC:  Recent Labs   02/05/17 1519 02/06/17 0458 02/07/17 0527 02/08/17 0554  WBC 7.0 7.7 7.8  --   HGB 15.8 12.2* 10.2* 10.3*  HCT 46.3 36.1* 29.8* 29.4*  MCV 85.9 86.4 86.9  --   PLT 138* 130* 120*  --    Lipid No results for input(s): CHOL, HDL, LDLCALC, TRIG in the last 8760 hours.  Cardiac Enzymes: No results for input(s): CKTOTAL, CKMB, CKMBINDEX, TROPONINI in the last 8760 hours. BNP: No results for input(s): BNP in the last 8760 hours. No results found for: [facility] Lab Results  Component Value Date   HGBA1C 5.6 06/19/2012   Lab Results  Component Value Date   TSH 3.177 06/19/2012   No results found for: VITAMINB12 No results found for: FOLATE No results found for: IRON, TIBC, FERRITIN  Imaging and Procedures obtained prior to SNF admission: Mr Hip Left Wo Contrast  Result Date: 02/05/2017 CLINICAL DATA:  Left hip pain since a fall off a stool this morning. Initial encounter. EXAM: MR OF THE LEFT HIP WITHOUT CONTRAST TECHNIQUE: Multiplanar, multisequence MR imaging was performed. No intravenous contrast was administered. COMPARISON:  Plain films left hip 02/05/2017 FINDINGS: Bones: The patient has an acute subcapital left hip fracture. The fracture extends into the intertrochanteric left femur. Associated marrow edema is identified. Bone marrow signal is otherwise normal. Articular cartilage and labrum Articular cartilage:  Preserved. Labrum:  Intact. Joint or bursal effusion Joint effusion:  Small left hip effusion is noted. Bursae:  Normal. Muscles and tendons Muscles and tendons:  Intact. Other findings Miscellaneous: Imaged intrapelvic contents demonstrate mild prostatomegaly. IMPRESSION: Acute subcapital left hip fracture extends into the intertrochanteric left femur. No other acute abnormality. Mild prostatomegaly. Electronically Signed   By: Drusilla Kanner M.D.   On: 02/05/2017 13:03   Dg Chest Port 1 View  Result Date: 02/05/2017 CLINICAL DATA:  Preoperative examination. EXAM:  PORTABLE CHEST 1 VIEW COMPARISON:  Chest x-ray dated June 19, 2012. FINDINGS: The cardiomediastinal silhouette is normal in size. Normal pulmonary vascularity. Hyperinflated lungs with emphysematous changes. No focal consolidation, pleural effusion, or pneumothorax. No acute osseous abnormality. IMPRESSION: COPD.  No active cardiopulmonary disease. Electronically Signed   By: Obie Dredge M.D.   On: 02/05/2017 15:49   Dg C-arm 1-60 Min-no Report  Result Date: 02/05/2017 Fluoroscopy was utilized by the requesting physician.  No radiographic interpretation.   Dg Hip Operative Unilat W Or W/o Pelvis Left  Result Date: 02/05/2017 CLINICAL DATA:  Internal fixation of left hip fracture. Initial encounter. EXAM: OPERATIVE LEFT HIP (WITH PELVIS IF PERFORMED) 2 VIEWS TECHNIQUE: Fluoroscopic spot image(s) were submitted for interpretation post-operatively. COMPARISON:  Left hip radiographs performed earlier today at 8:59 a.m. FINDINGS: Two fluoroscopic C-arm images are provided from the OR, demonstrating placement of a left hip screw transfixing the known intertrochanteric fracture in anatomic alignment. The left femoral head remains seated at the acetabulum. IMPRESSION: Status post internal fixation of left femoral intertrochanteric fracture in anatomic alignment. Electronically Signed   By: Roanna Raider M.D.   On: 02/05/2017 22:04   Dg Hip Unilat With Pelvis 2-3 Views Left  Result Date: 02/05/2017 CLINICAL DATA:  Larey Seat from stool and landed on left side. Complains of left hip pain. EXAM: DG HIP (WITH OR WITHOUT PELVIS) 2-3V LEFT COMPARISON:  None. FINDINGS: Pelvic bony ring is intact. Left hip is located without a fracture. Normal appearance of the SI joints. No significant joint space narrowing in either hip. IMPRESSION: No acute bone abnormality to the pelvis or left hip. If there is high clinical suspicion for an occult left hip fracture, recommend further characterization with CT or MRI.  Electronically Signed   By: Richarda Overlie M.D.   On: 02/05/2017 09:14     Not all labs, radiology exams or other studies done during hospitalization come through on my EPIC note; however they are reviewed by me.    Assessment and Plan  LEFT ACUTE SUBCAPITAL HIP FRACTURE-status post intramedullary nailing; no complications; prophylaxis with aspirin 81 mg by mouth twice a day for 6 weeks. SNF - admitted for OT/PT. Plan to continue ASA 81 mg by mouth twice a day for 6 weeks  ACUTE BLOOD LOSS ANEMIA/THROMBOCYTOPENIA postop-DC hemoglobin 10.2; did not require a transfusion; platelets were 120 SNF _ will follow-up CBC; continue iron 325 mg by mouth twice a day  MINIMAL ELEVATED TOTAL BILIRUBIN-unclear significance; could have Gilbert's syndrome; trended down; no symptoms to suggest any other etiology SNF - we will monitor  GERD SNF - formerly on no therapy; patient was put on Protonix 40 mg by mouth daily  Peripheral vascular disease SNF - patient will be on ASA 81 mg twice a day for 6 weeks as prophylaxis week return to his 81 mg by mouth daily  Chronic low back pain SNF - patient continues with ibuprofen 400 mg every 8 when necessary which she was taking prior to surgery   Time spent greater than 35 minutes;> 50% of time with patient was spent reviewing records, labs, tests and studies, counseling and developing plans  Thurston Hole D. Lyn Hollingshead, MD

## 2017-02-10 ENCOUNTER — Encounter: Payer: Self-pay | Admitting: Internal Medicine

## 2017-02-10 DIAGNOSIS — D62 Acute posthemorrhagic anemia: Secondary | ICD-10-CM | POA: Insufficient documentation

## 2017-02-10 DIAGNOSIS — M549 Dorsalgia, unspecified: Secondary | ICD-10-CM | POA: Insufficient documentation

## 2017-02-24 ENCOUNTER — Non-Acute Institutional Stay (SKILLED_NURSING_FACILITY): Payer: Medicare (Managed Care) | Admitting: Internal Medicine

## 2017-02-24 ENCOUNTER — Encounter: Payer: Self-pay | Admitting: Internal Medicine

## 2017-02-24 DIAGNOSIS — I739 Peripheral vascular disease, unspecified: Secondary | ICD-10-CM

## 2017-02-24 DIAGNOSIS — K219 Gastro-esophageal reflux disease without esophagitis: Secondary | ICD-10-CM | POA: Diagnosis not present

## 2017-02-24 DIAGNOSIS — S72002D Fracture of unspecified part of neck of left femur, subsequent encounter for closed fracture with routine healing: Secondary | ICD-10-CM

## 2017-02-24 DIAGNOSIS — D696 Thrombocytopenia, unspecified: Secondary | ICD-10-CM | POA: Diagnosis not present

## 2017-02-24 DIAGNOSIS — D62 Acute posthemorrhagic anemia: Secondary | ICD-10-CM | POA: Diagnosis not present

## 2017-02-24 DIAGNOSIS — M545 Low back pain, unspecified: Secondary | ICD-10-CM

## 2017-02-24 DIAGNOSIS — G8929 Other chronic pain: Secondary | ICD-10-CM

## 2017-02-24 DIAGNOSIS — R17 Unspecified jaundice: Secondary | ICD-10-CM | POA: Diagnosis not present

## 2017-02-24 NOTE — Progress Notes (Signed)
[facility] Room Number: 513 Place of Service:  SNF [phone]  Provider: Randon GoldsmithAnne D. Lyn HollingsheadAlexander, MD  PCP: Abelina Bachelorean, Louis Alan, MD Patient Care Team: Abelina Bachelorean, Louis Alan, MD as PCP - General (Family Medicine) Yolonda Kidaogers, Jason Patrick, MD as Consulting Physician (Orthopedic Surgery)  Extended Emergency Contact Information Primary Emergency Contact: Audrie GallusNikouyeh,Khadejeah  [place] Home Phone: [phone] Mobile Phone: [phone] Relation: Daughter  No Known Allergies  Chief Complaint  Patient presents with  . Discharge Note    discharge from SNF to home    HPI:  71 y.o. male  With GERD, PVD, BPH, who [facility] for repair of his left hip fracture which she sustained when he fell from his stool and hit the ground on his left hip. Surgery went well with minimal postoperative anemia not requiring a transfusion and aspirin was to be used as DVT prophylaxis. Patient was admitted to skilled nursing facility for OT/PT and is now ready to be discharged to home.    Past Medical History:  Diagnosis Date  . Anxiety   . Back pain   . GERD (gastroesophageal reflux disease)   . Hip fracture (HCC) 02/05/2017   left  . Peripheral vascular disease (HCC)   . Prostate disease   . Thrombocytopenia (HCC) 02/05/2017    Past Surgical History:  Procedure Laterality Date  . ANKLE SURGERY     bilateral ankle surgery  . COLONOSCOPY  01/07/2011  . OTHER SURGICAL HISTORY     unspecified type of prostate surgery     reports that he has quit smoking. His smoking use included cigarettes. He has a 35.00 pack-year smoking history. he has never used smokeless tobacco. He reports that he does not drink alcohol or use drugs. Social History   Socioeconomic History  . Marital status: Widowed    Spouse name: Not on file  . Number of children: Not on file  . Years of education: Not on file  . Highest education level: Not on  file  Social Needs  . Financial resource strain: Not on file  . Food insecurity - worry: Not on file  . Food insecurity - inability: Not on file  . Transportation needs - medical: Not on file  . Transportation needs - non-medical: Not on file  Occupational History  . Occupation: Disabled    Comment: secondary to accident at work several years ago.  Tobacco Use  . Smoking status: Former Smoker    Packs/day: 1.00    Years: 35.00    Pack years: 35.00    Types: Cigarettes  . Smokeless tobacco: Never Used  Substance and Sexual Activity  . 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  . Drug use: No  . Sexual activity: Not on file  Other Topics Concern  . Not on file  Social History Narrative    Formerly worked in [employer], [employer]. Emigrated from [place] when he was 71 years old.    [facility]   Widowed, one daughter, two sons   Former smoker   Alcohol none   Full Code.     Pertinent  Health Maintenance Due  Topic Date Due  . COLONOSCOPY  01/06/1996  . PNA vac Low Risk Adult (1 of 2 - PCV13) 04/28/2011  . INFLUENZA VACCINE  11/25/2016    Medications: Allergies as of 02/24/2017   No Known  Allergies     Medication List        Accurate as of 02/24/17 11:59 PM. Always use your most recent med list.          aspirin EC 81 MG tablet Take 1 tablet (81 mg total) by mouth 2 (two) times daily.   bisacodyl 5 MG EC tablet Commonly known as:  DULCOLAX Take 1 tablet (5 mg total) by mouth daily.   ferrous sulfate 325 (65 FE) MG tablet Take 1 tablet (325 mg total) by mouth 2 (two) times daily with a meal.   ibuprofen 400 MG tablet Commonly known as:  ADVIL,MOTRIN Take 1 tablet (400 mg total) by mouth every 8 (eight) hours as needed for moderate pain.   methocarbamol 500 MG tablet Commonly known as:  ROBAXIN Take 500 mg by mouth. Take one tablet every 6 hours as needed for muscle spasms   pantoprazole 40 MG  tablet Commonly known as:  PROTONIX Take 1 tablet (40 mg total) by mouth daily.   polyethylene glycol packet Commonly known as:  MIRALAX / GLYCOLAX Take 17 g by mouth daily.   vitamin E 1000 UNIT capsule Take 1,000 Units by mouth daily.        Vitals:   02/24/17 1033  BP: 133/77  Pulse: 69  Resp: 16  Temp: (!) 97.4 F (36.3 C)  Weight: 121 lb (54.9 kg)  Height: 5\' 2"  (1.575 m)   Body mass index is 22.13 kg/m.  Physical Exam  GENERAL APPEARANCE: Alert, conversant. No acute distress.  HEENT: Unremarkable. RESPIRATORY: Breathing is even, unlabored. Lung sounds are clear   CARDIOVASCULAR: Heart RRR no murmurs, rubs or gallops. No peripheral edema.  GASTROINTESTINAL: Abdomen is soft, non-tender, not distended w/ normal bowel sounds.  NEUROLOGIC: Cranial nerves 2-12 grossly intact. Moves all extremities   Labs reviewed: Basic Metabolic Panel: Recent Labs    02/05/17 1519 02/06/17 0458 02/07/17 0527  NA 140 140 140  K 3.9 4.3 3.8  CL 103 105 105  CO2 30 26 31   GLUCOSE 101* 176* 105*  BUN 17 12 14   CREATININE 0.87 0.86 0.88  CALCIUM 8.7* 7.9* 7.8*   No results found for: [facility] Liver Function Tests: Recent Labs    02/05/17 1519 02/06/17 0458 02/07/17 0527  AST 22 25 18   ALT 19 14* 11*  ALKPHOS 65 43 43  BILITOT 2.5* 1.3* 0.6  PROT 6.4* 5.0* 4.5*  ALBUMIN 4.2 2.9* 2.7*   No results for input(s): LIPASE, AMYLASE in the last 8760 hours. No results for input(s): AMMONIA in the last 8760 hours. CBC: Recent Labs    02/05/17 1519 02/06/17 0458 02/07/17 0527 02/08/17 0554  WBC 7.0 7.7 7.8  --   HGB 15.8 12.2* 10.2* 10.3*  HCT 46.3 36.1* 29.8* 29.4*  MCV 85.9 86.4 86.9  --   PLT 138* 130* 120*  --    Lipid No results for input(s): CHOL, HDL, LDLCALC, TRIG in the last 8760 hours. Cardiac Enzymes: No results for input(s): CKTOTAL, CKMB, CKMBINDEX, TROPONINI in the last 8760 hours. BNP: No results for input(s): BNP in the last 8760  hours. CBG: No results for input(s): GLUCAP in the last 8760 hours.  Procedures and Imaging Studies During Stay: Mr Hip Left Wo Contrast  Result Date: 02/05/2017 CLINICAL DATA:  Left hip pain since a fall off a stool this morning. Initial encounter. EXAM: MR OF THE LEFT HIP WITHOUT CONTRAST TECHNIQUE: Multiplanar, multisequence MR imaging was performed. No intravenous contrast was administered. COMPARISON:  Plain films left hip 02/05/2017 FINDINGS: Bones: The patient has an acute subcapital left hip fracture. The fracture extends into the intertrochanteric left femur. Associated marrow edema is identified. Bone marrow signal is otherwise normal. Articular cartilage and labrum Articular cartilage:  Preserved. Labrum:  Intact. Joint or bursal effusion Joint effusion:  Small left hip effusion is noted. Bursae:  Normal. Muscles and tendons Muscles and tendons:  Intact. Other findings Miscellaneous: Imaged intrapelvic contents demonstrate mild prostatomegaly. IMPRESSION: Acute subcapital left hip fracture extends into the intertrochanteric left femur. No other acute abnormality. Mild prostatomegaly. Electronically Signed   By: Drusilla Kanner M.D.   On: 02/05/2017 13:03   Dg Chest Port 1 View  Result Date: 02/05/2017 CLINICAL DATA:  Preoperative examination. EXAM: PORTABLE CHEST 1 VIEW COMPARISON:  Chest x-ray dated June 19, 2012. FINDINGS: The cardiomediastinal silhouette is normal in size. Normal pulmonary vascularity. Hyperinflated lungs with emphysematous changes. No focal consolidation, pleural effusion, or pneumothorax. No acute osseous abnormality. IMPRESSION: COPD.  No active cardiopulmonary disease. Electronically Signed   By: Obie Dredge M.D.   On: 02/05/2017 15:49   Dg C-arm 1-60 Min-no Report  Result Date: 02/05/2017 Fluoroscopy was utilized by the requesting physician.  No radiographic interpretation.   Dg Hip Operative Unilat W Or W/o Pelvis Left  Result Date:  02/05/2017 CLINICAL DATA:  Internal fixation of left hip fracture. Initial encounter. EXAM: OPERATIVE LEFT HIP (WITH PELVIS IF PERFORMED) 2 VIEWS TECHNIQUE: Fluoroscopic spot image(s) were submitted for interpretation post-operatively. COMPARISON:  Left hip radiographs performed earlier today at 8:59 a.m. FINDINGS: Two fluoroscopic C-arm images are provided from the OR, demonstrating placement of a left hip screw transfixing the known intertrochanteric fracture in anatomic alignment. The left femoral head remains seated at the acetabulum. IMPRESSION: Status post internal fixation of left femoral intertrochanteric fracture in anatomic alignment. Electronically Signed   By: Roanna Raider M.D.   On: 02/05/2017 22:04   Dg Hip Unilat With Pelvis 2-3 Views Left  Result Date: 02/05/2017 CLINICAL DATA:  Larey Seat from stool and landed on left side. Complains of left hip pain. EXAM: DG HIP (WITH OR WITHOUT PELVIS) 2-3V LEFT COMPARISON:  None. FINDINGS: Pelvic bony ring is intact. Left hip is located without a fracture. Normal appearance of the SI joints. No significant joint space narrowing in either hip. IMPRESSION: No acute bone abnormality to the pelvis or left hip. If there is high clinical suspicion for an occult left hip fracture, recommend further characterization with CT or MRI. Electronically Signed   By: Richarda Overlie M.D.   On: 02/05/2017 09:14    Assessment/Plan:   Closed fracture of left hip with routine healing, subsequent encounter  Acute blood loss as cause of postoperative anemia  Thrombocytopenia (HCC)  Elevated bilirubin  Chronic midline low back pain without sciatica  Gastroesophageal reflux disease without esophagitis  Peripheral vascular disease (HCC)   Patient is being discharged with the following home health services: OT/PT/nursing   Patient is being discharged with the following durable medical equipment: None   Patient has been advised to f/u with their PCP in 1-2 weeks to  bring them up to date on their rehab stay.  Social services at facility was responsible for arranging this appointment.  Pt was provided with a 30 day supply of prescriptions for medications and refills must be obtained from their PCP.  For controlled substances, a more limited supply may be provided adequate until PCP appointment only.  Medications have been reconciled.  Time spent greater than  30 minutes;> 50% of time with patient was spent reviewing records, labs, tests and studies, counseling and developing plan of care  Noah Delaine. Sheppard Coil, MD

## 2017-02-28 ENCOUNTER — Encounter: Payer: Self-pay | Admitting: Internal Medicine

## 2018-02-22 ENCOUNTER — Ambulatory Visit (HOSPITAL_COMMUNITY)
Admission: RE | Admit: 2018-02-22 | Discharge: 2018-02-22 | Disposition: A | Discharge status: Home / Self Care | Payer: Medicare (Managed Care) | Source: Ambulatory Visit | Attending: Vascular Surgery | Admitting: Vascular Surgery

## 2018-02-22 ENCOUNTER — Other Ambulatory Visit: Payer: Self-pay

## 2018-02-22 ENCOUNTER — Ambulatory Visit (INDEPENDENT_AMBULATORY_CARE_PROVIDER_SITE_OTHER): Payer: Medicare (Managed Care) | Admitting: Vascular Surgery

## 2018-02-22 ENCOUNTER — Encounter: Payer: Self-pay | Admitting: Vascular Surgery

## 2018-02-22 VITALS — BP 153/87 | HR 71 | Resp 18 | Ht 62.0 in | Wt 113.4 lb

## 2018-02-22 DIAGNOSIS — I7300 Raynaud's syndrome without gangrene: Secondary | ICD-10-CM | POA: Diagnosis not present

## 2018-02-22 DIAGNOSIS — I739 Peripheral vascular disease, unspecified: Secondary | ICD-10-CM

## 2018-02-22 NOTE — Progress Notes (Signed)
[facility]in [facility]ist of [facility]  Patient name: Mark Hansen MRN: [id number] DOB: Jun 01, 1945 Sex: male  REASON FOR CONSULT: Cyanosis toes of both feet  HPI: Mark Hansen is a 72 y.o. male, who is seen today for evaluation of cyanosis of both feet.  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  Had actually seen vascular specialist in the past.  At that time he was smoking and was told that he had a very high risk for losing his lower extremities if he did not quit.  The patient quit that day and has not smoked since.  I did congratulate him on this.  He reports that he does not have much difficulty in the summertime and has been told to wear socks even at bed at night.  He has had occasional episodes in his hands but this is much worse in his feet.  He does not have any history of tissue loss.  He reports that he did try a medication for the condition several years ago but had no benefit.  It sounds as though this was nifedipine or other calcium channel blocker.  Past Medical History:  Diagnosis Date  . Anxiety   . Back pain   . GERD (gastroesophageal reflux disease)   . Hip fracture (HCC) 02/05/2017   left  . Peripheral vascular disease (HCC)   . Prostate disease   . Thrombocytopenia (HCC) 02/05/2017    Family History  Problem Relation Age of Onset  . CAD Brother 50       requiring bypass  . CAD Mother 8  . Asthma Mother   . Alzheimer's disease Father   . Emphysema Father   . Kidney disease Unknown        cousin    SOCIAL HISTORY: Social History   Socioeconomic History  . Marital status: Widowed    Spouse name: Not on file  . Number of children: Not on file  . Years of education: Not on file  . Highest education level: Not on file  Occupational History  . Occupation: Disabled    Comment: secondary to accident at work several years ago.  Social Needs  . Financial resource strain: Not on file  . Food insecurity:    Worry: Not on  file    Inability: Not on file  . Transportation needs:    Medical: Not on file    Non-medical: Not on file  Tobacco Use  . Smoking status: Former Smoker    Packs/day: 1.00    Years: 35.00    Pack years: 35.00    Types: Cigarettes  . Smokeless tobacco: Never Used  Substance and Sexual Activity  . 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  . Drug use: No  . Sexual activity: Not on file  Lifestyle  . Physical activity:    Days per week: Not on file    Minutes per session: Not on file  . Stress: Not on file  Relationships  . Social connections:    Talks on phone: Not on file    Gets together: Not on file    Attends religious service: Not on file    Active member of club or organization: Not on file    Attends meetings of clubs or organizations: Not on file    Relationship status: Not on file  . Intimate partner violence:    Fear of current or ex partner: Not on file    Emotionally  abused: Not on file    Physically abused: Not on file    Forced sexual activity: Not on file  Other Topics Concern  . Not on file  Social History Narrative    Formerly worked in [employer], commissioning. Emigrated from [place] when he was 72 years old.    Admitted to [facility]   Widowed, one daughter, two sons   Former smoker   Alcohol none   Full Code.     No Known Allergies  Current Outpatient Medications  Medication Sig Dispense Refill  . bisacodyl (DULCOLAX) 5 MG EC tablet Take 1 tablet (5 mg total) by mouth daily. 30 tablet 0  . vitamin E 1000 UNIT capsule Take 1,000 Units by mouth daily.    . ferrous sulfate 325 (65 FE) MG tablet Take 1 tablet (325 mg total) by mouth 2 (two) times daily with a meal. (Patient not taking: Reported on 02/22/2018)    . ibuprofen (ADVIL,MOTRIN) 400 MG tablet Take 1 tablet (400 mg total) by mouth every 8 (eight) hours as needed for moderate pain. (Patient not taking: Reported on 02/22/2018)    . methocarbamol (ROBAXIN)  500 MG tablet Take 500 mg by mouth. Take one tablet every 6 hours as needed for muscle spasms    . pantoprazole (PROTONIX) 40 MG tablet Take 1 tablet (40 mg total) by mouth daily. 30 tablet 1  . polyethylene glycol (MIRALAX / GLYCOLAX) packet Take 17 g by mouth daily. (Patient not taking: Reported on 02/22/2018) 14 each 0   No current facility-administered medications for this visit.     REVIEW OF SYSTEMS:  [X]  denotes positive finding, [ ]  denotes negative finding Cardiac  Comments:  Chest pain or chest pressure:    Shortness of breath upon exertion:    Short of breath when lying flat:    Irregular heart rhythm:        Vascular    Pain in calf, thigh, or hip brought on by ambulation:    Pain in feet at night that wakes you up from your sleep:     Blood clot in your veins:    Leg swelling:         Pulmonary    Oxygen at home:    Productive cough:     Wheezing:         Neurologic    Sudden weakness in arms or legs:     Sudden numbness in arms or legs:     Sudden onset of difficulty speaking or slurred speech:    Temporary loss of vision in one eye:     Problems with dizziness:         Gastrointestinal    Blood in stool:     Vomited blood:         Genitourinary    Burning when urinating:     Blood in urine:        Psychiatric    Major depression:         Hematologic    Bleeding problems:    Problems with blood clotting too easily:        Skin    Rashes or ulcers:        Constitutional    Fever or chills:      PHYSICAL EXAM: Vitals:   02/22/18 0940  BP: (!) 153/87  Pulse: 71  Resp: 18  SpO2: 100%  Weight: 113 lb 6.4 oz (51.4 kg)  Height: 5\' 2"  (1.575 m)  GENERAL: The patient is a well-nourished male, in no acute distress. The vital signs are documented above. CARDIOVASCULAR: Carotid arteries without bruits bilaterally.  2+ radial 2+ popliteal and 2+ dorsalis pedis pulses bilaterally PULMONARY: There is good air exchange  ABDOMEN: Soft and non-tender   MUSCULOSKELETAL: There are no major deformities or cyanosis. NEUROLOGIC: No focal weakness or paresthesias are detected. SKIN: There are no ulcers or rashes noted.  He does have cyanosis of the toes of all his toes on both feet extending up into the tarsal heads.  No tissue loss PSYCHIATRIC: The patient has a normal affect.  DATA:  Noninvasive studies showed normal ankle arm index and biphasic waveforms at the dorsalis pedis and posterior tibial bilaterally.  Abnormally blunted waveforms in all toes  MEDICAL ISSUES: Classic Raynauds phenomenon.  I did congratulate him on his smoking cessation and explained that this would minimize his risk for tissue loss.  I did explain that he is at very low risk for limb loss but would be at risk for toe loss.  His only trigger seems to be cold exposure and he will continue to do what he can to keep his feet warm in the wintertime.  He was reassured with this discussion and will see [place] again on an as-needed basis   Larina Earthly, MD [facility] V[facility] and Vein Specialists of [facility] Tel [phone] Pager [phone]

## 2019-03-11 IMAGING — RF DG HIP (WITH PELVIS) OPERATIVE*L*
1 series · 2 of 2 positions shown · non-contrast
Comparison: Left hip radiographs performed earlier today at [DATE]
a.m.

CLINICAL DATA: Internal fixation of left hip fracture. Initial
encounter.

EXAM:
OPERATIVE LEFT HIP (WITH PELVIS IF PERFORMED) 2 VIEWS
TECHNIQUE: Fluoroscopic spot image(s) were submitted for interpretation
post-operatively.

[Series 1: run · 2 of 2 slices shown]
[im 1/2]
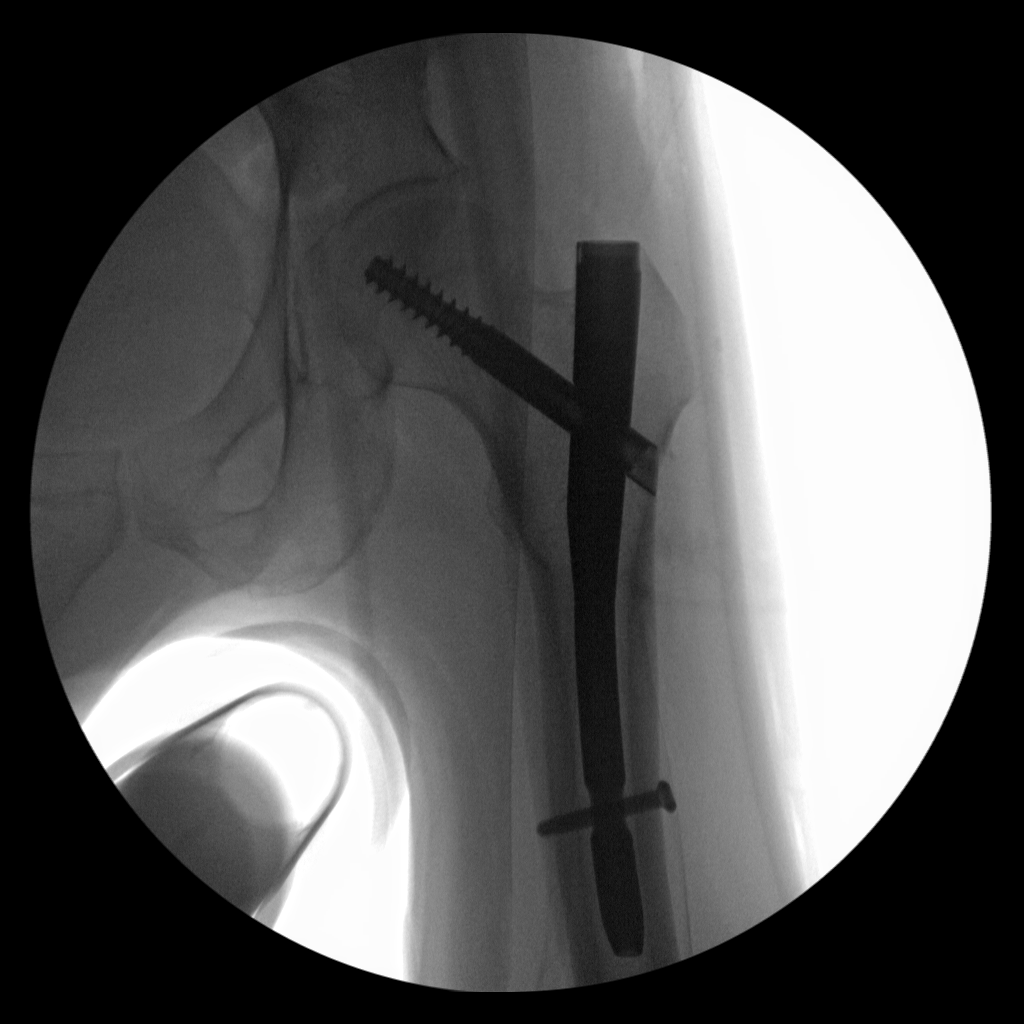
[im 2/2]
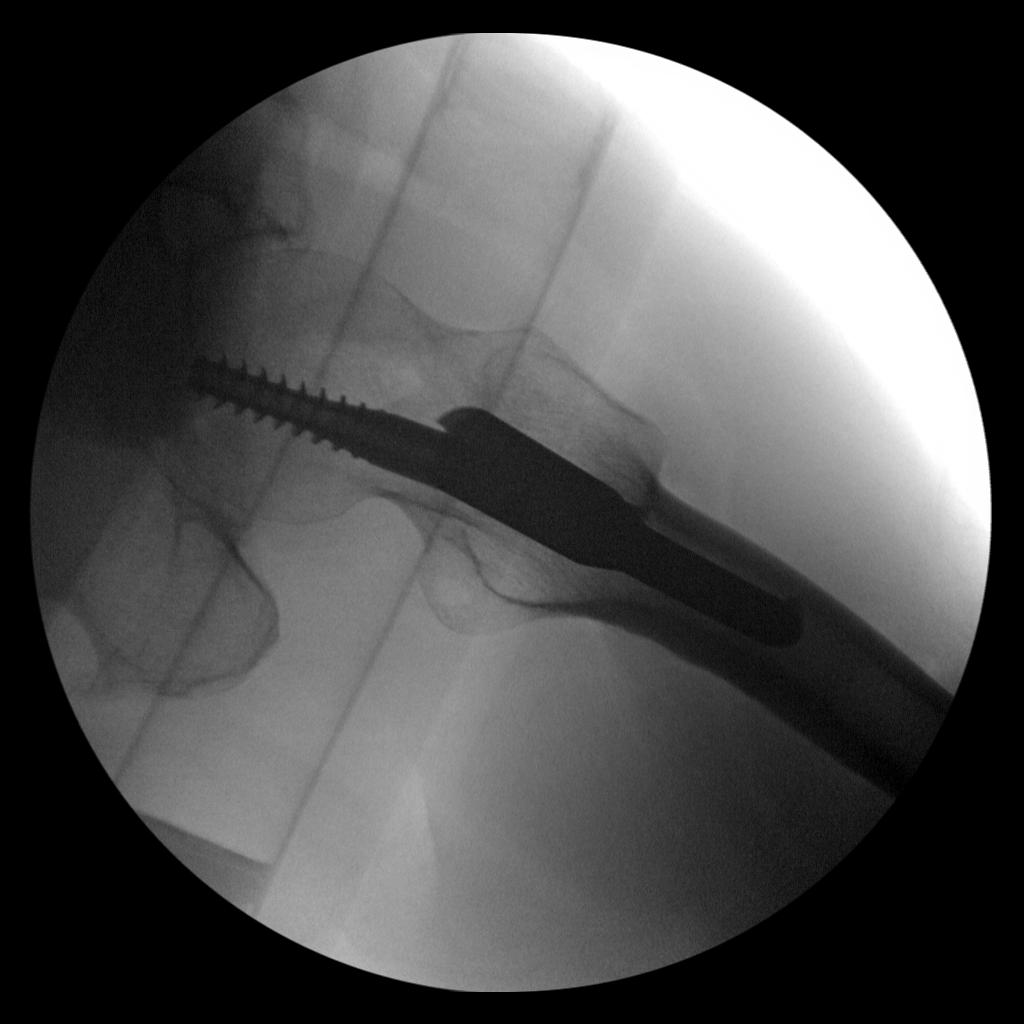

[2 of 2 positions shown; findings below may reference images not displayed]

FINDINGS: Two fluoroscopic C-arm images are provided from the OR,
demonstrating placement of a left hip screw transfixing the known
intertrochanteric fracture in anatomic alignment. The left femoral
head remains seated at the acetabulum.
IMPRESSION: Status post internal fixation of left femoral intertrochanteric
fracture in anatomic alignment.

## 2019-03-11 IMAGING — CR DG HIP (WITH OR WITHOUT PELVIS) 2-3V*L*
3 series · 3 of 3 positions shown · non-contrast
Comparison: None.

CLINICAL DATA: Fell from stool and landed on left side. Complains
of left hip pain.

EXAM:
DG HIP (WITH OR WITHOUT PELVIS) 2-3V LEFT

[x pelvis]
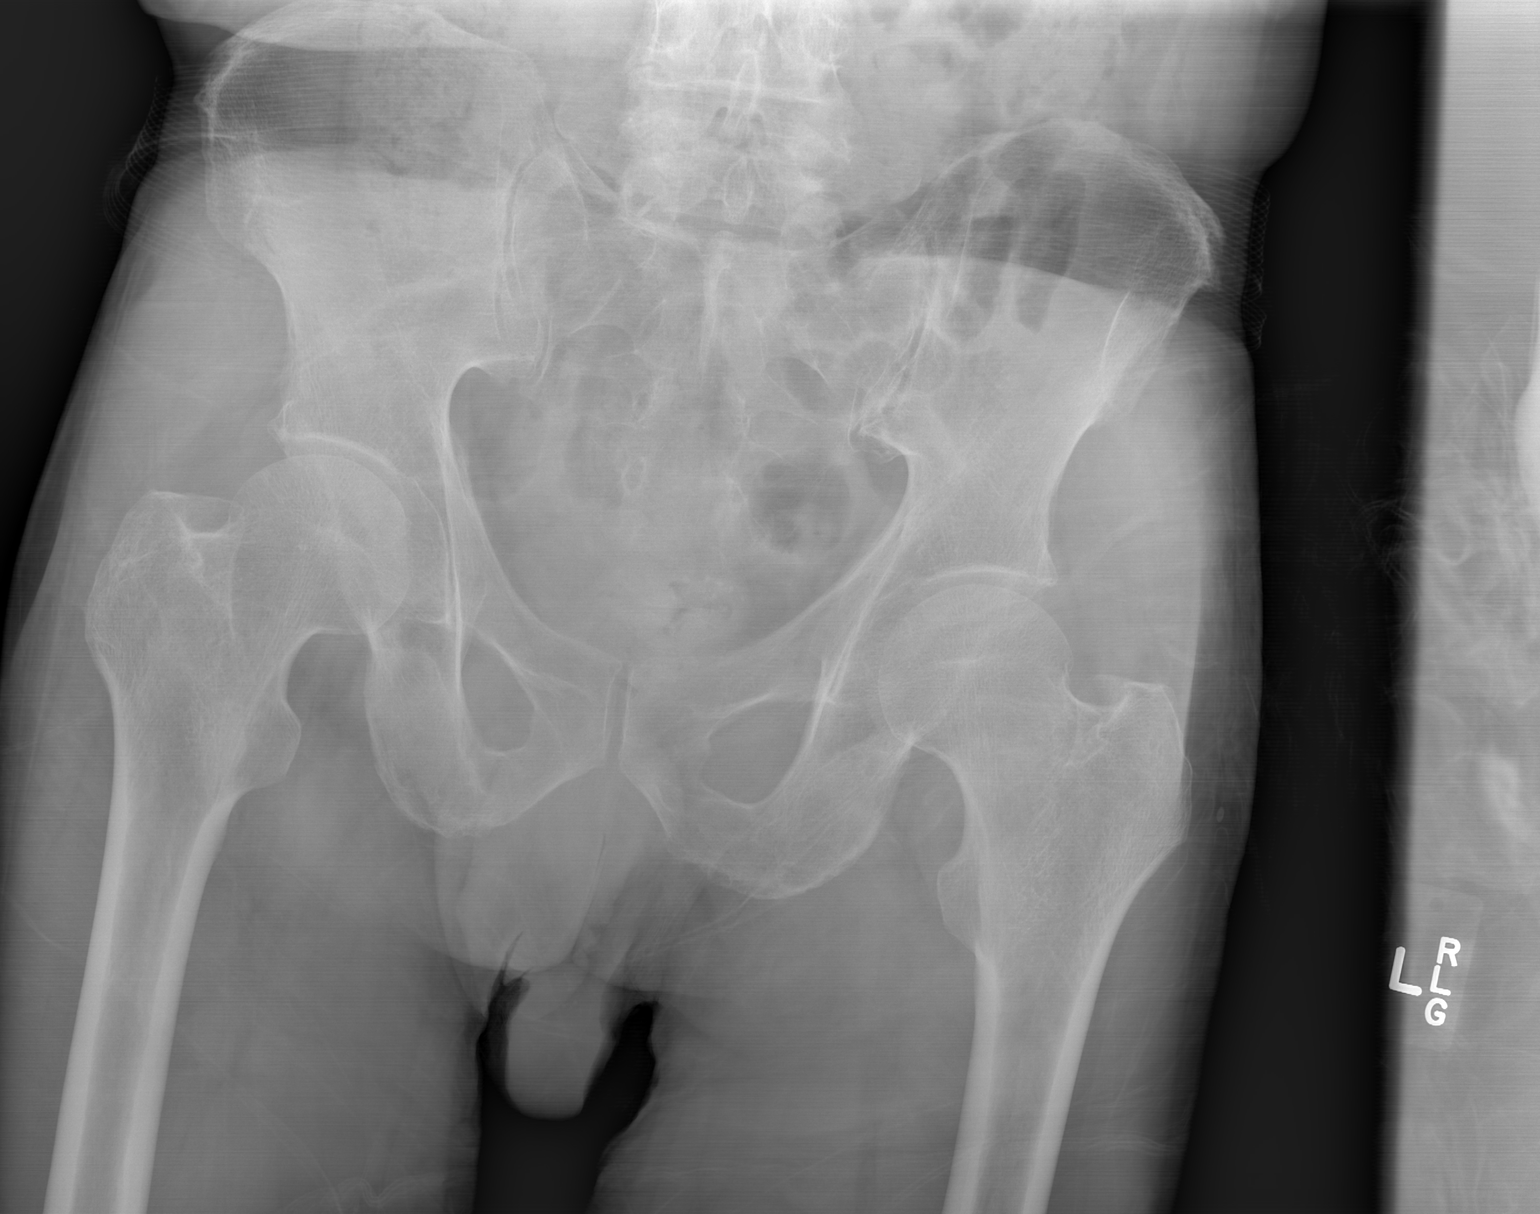

[x hip ap left]
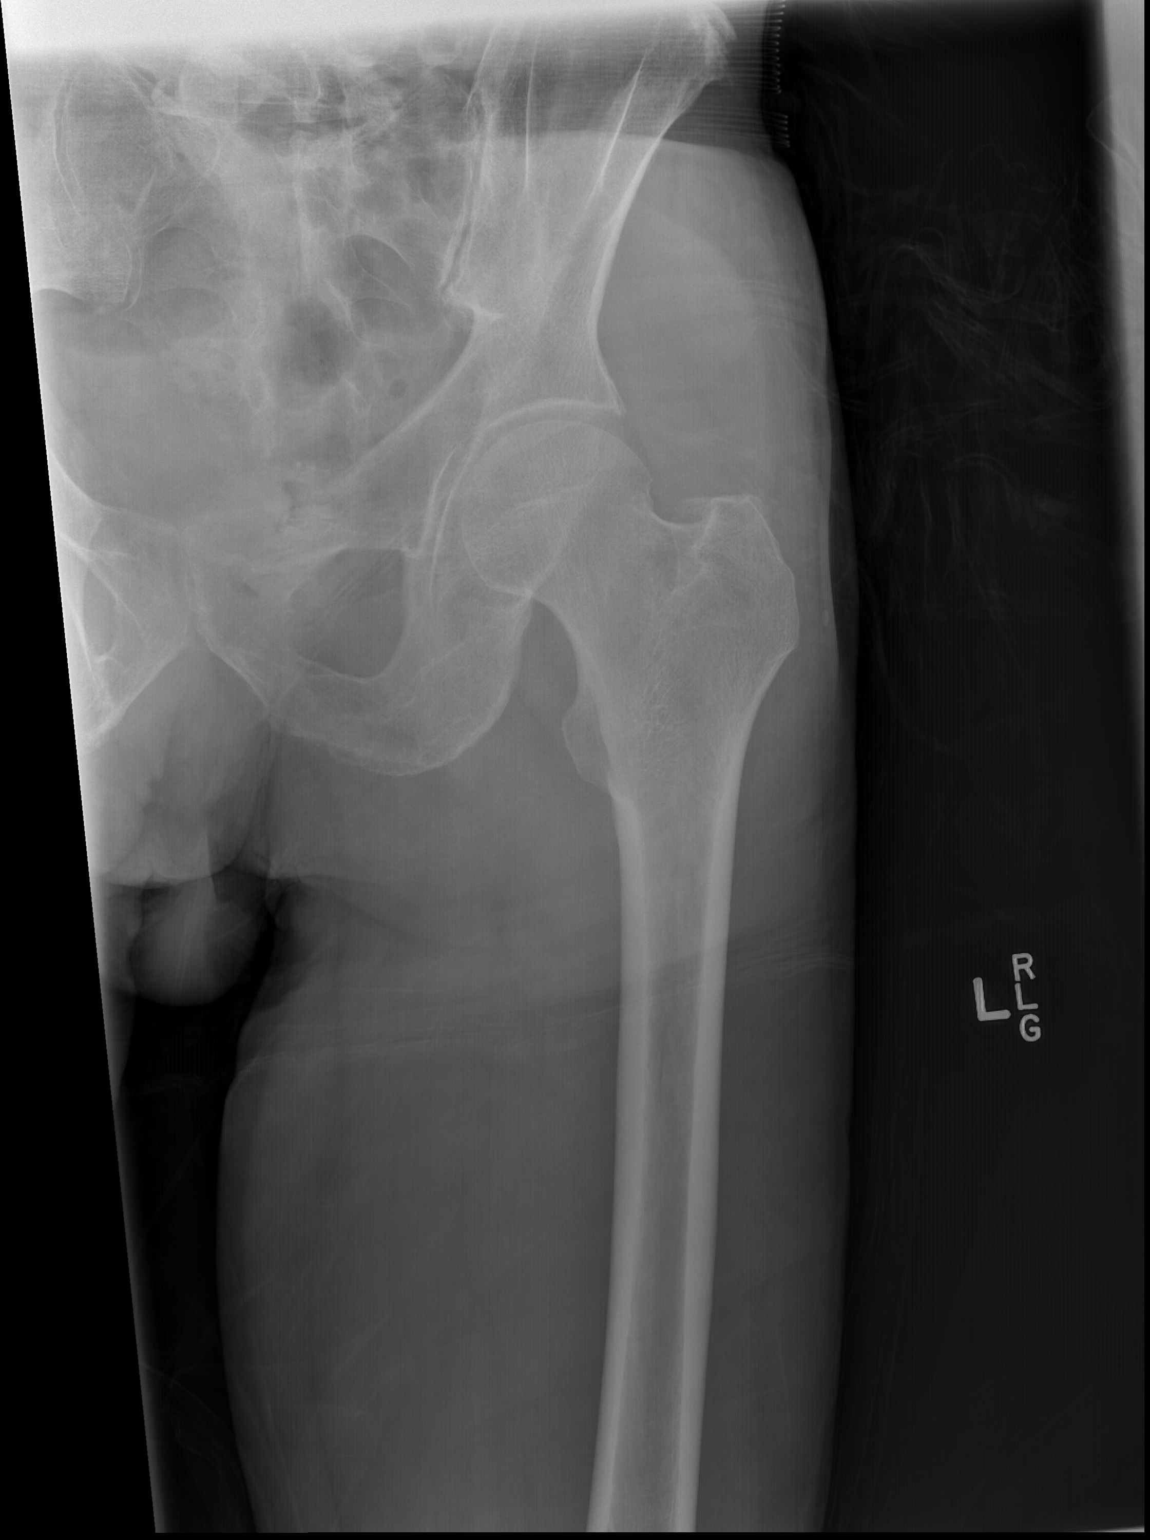

[w hip lat left]
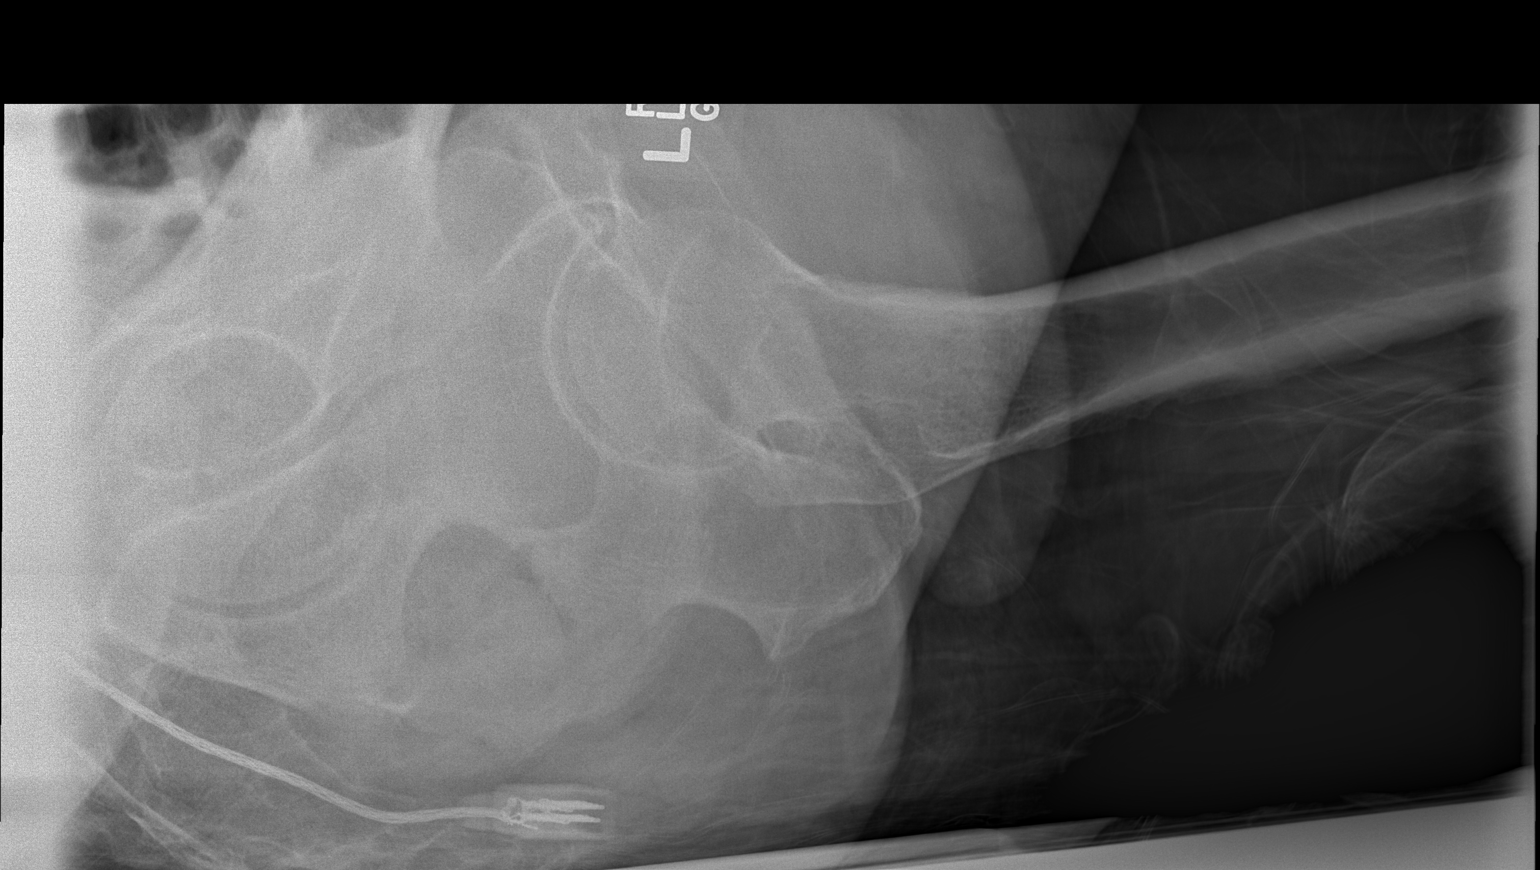

[3 of 3 positions shown; findings below may reference images not displayed]

FINDINGS: Pelvic bony ring is intact. Left hip is located without a fracture.
Normal appearance of the SI joints. No significant joint space
narrowing in either hip.
IMPRESSION: No acute bone abnormality to the pelvis or left hip. If there is
high clinical suspicion for an occult left hip fracture, recommend
further characterization with CT or MRI.

## 2021-01-27 DIAGNOSIS — I739 Peripheral vascular disease, unspecified: Secondary | ICD-10-CM | POA: Diagnosis not present

## 2021-01-27 DIAGNOSIS — M792 Neuralgia and neuritis, unspecified: Secondary | ICD-10-CM | POA: Diagnosis not present

## 2021-04-01 DIAGNOSIS — Z125 Encounter for screening for malignant neoplasm of prostate: Secondary | ICD-10-CM | POA: Diagnosis not present

## 2021-04-01 DIAGNOSIS — Z0001 Encounter for general adult medical examination with abnormal findings: Secondary | ICD-10-CM | POA: Diagnosis not present

## 2021-04-01 DIAGNOSIS — M130 Polyarthritis, unspecified: Secondary | ICD-10-CM | POA: Diagnosis not present

## 2021-04-01 DIAGNOSIS — G63 Polyneuropathy in diseases classified elsewhere: Secondary | ICD-10-CM | POA: Diagnosis not present

## 2021-04-01 DIAGNOSIS — E785 Hyperlipidemia, unspecified: Secondary | ICD-10-CM | POA: Diagnosis not present

## 2021-12-09 DIAGNOSIS — G63 Polyneuropathy in diseases classified elsewhere: Secondary | ICD-10-CM | POA: Diagnosis not present

## 2021-12-09 DIAGNOSIS — E785 Hyperlipidemia, unspecified: Secondary | ICD-10-CM | POA: Diagnosis not present

## 2021-12-09 DIAGNOSIS — Z125 Encounter for screening for malignant neoplasm of prostate: Secondary | ICD-10-CM | POA: Diagnosis not present

## 2021-12-09 DIAGNOSIS — Z0000 Encounter for general adult medical examination without abnormal findings: Secondary | ICD-10-CM | POA: Diagnosis not present

## 2021-12-09 DIAGNOSIS — Z6820 Body mass index (BMI) 20.0-20.9, adult: Secondary | ICD-10-CM | POA: Diagnosis not present

## 2021-12-09 DIAGNOSIS — R972 Elevated prostate specific antigen [PSA]: Secondary | ICD-10-CM | POA: Diagnosis not present

## 2021-12-09 DIAGNOSIS — R030 Elevated blood-pressure reading, without diagnosis of hypertension: Secondary | ICD-10-CM | POA: Diagnosis not present

## 2021-12-09 DIAGNOSIS — I7300 Raynaud's syndrome without gangrene: Secondary | ICD-10-CM | POA: Diagnosis not present

## 2021-12-09 DIAGNOSIS — I739 Peripheral vascular disease, unspecified: Secondary | ICD-10-CM | POA: Diagnosis not present

## 2021-12-15 ENCOUNTER — Ambulatory Visit
Admission: RE | Admit: 2021-12-15 | Discharge: 2021-12-15 | Disposition: A | Discharge status: Home / Self Care | Payer: Medicare (Managed Care) | Source: Ambulatory Visit | Attending: Family Medicine | Admitting: Family Medicine

## 2021-12-15 ENCOUNTER — Other Ambulatory Visit: Payer: Self-pay | Admitting: Family Medicine

## 2021-12-15 DIAGNOSIS — M48061 Spinal stenosis, lumbar region without neurogenic claudication: Secondary | ICD-10-CM | POA: Diagnosis not present

## 2021-12-15 DIAGNOSIS — M545 Low back pain, unspecified: Secondary | ICD-10-CM

## 2021-12-15 DIAGNOSIS — R972 Elevated prostate specific antigen [PSA]: Secondary | ICD-10-CM

## 2021-12-17 ENCOUNTER — Other Ambulatory Visit: Payer: Self-pay | Admitting: Family Medicine

## 2021-12-17 ENCOUNTER — Ambulatory Visit
Admission: RE | Admit: 2021-12-17 | Discharge: 2021-12-17 | Disposition: A | Discharge status: Home / Self Care | Payer: Medicare (Managed Care) | Source: Ambulatory Visit | Attending: Family Medicine | Admitting: Family Medicine

## 2021-12-17 DIAGNOSIS — R0602 Shortness of breath: Secondary | ICD-10-CM

## 2021-12-17 DIAGNOSIS — R0789 Other chest pain: Secondary | ICD-10-CM | POA: Diagnosis not present

## 2021-12-23 DIAGNOSIS — I10 Essential (primary) hypertension: Secondary | ICD-10-CM | POA: Diagnosis not present

## 2021-12-23 DIAGNOSIS — M130 Polyarthritis, unspecified: Secondary | ICD-10-CM | POA: Diagnosis not present

## 2021-12-23 DIAGNOSIS — R0602 Shortness of breath: Secondary | ICD-10-CM | POA: Diagnosis not present

## 2021-12-23 DIAGNOSIS — I739 Peripheral vascular disease, unspecified: Secondary | ICD-10-CM | POA: Diagnosis not present

## 2021-12-24 NOTE — Progress Notes (Signed)
Office Note     CC: Bilateral lower extremity toe cyanosis Requesting Provider:  Jeri Lager, *  HPI: Mark Hansen is a 76 y.o. (Mar 13, 1946) male presenting at the request of .Renaye Rakers, MD for bilateral lower extremity toe cyanosis.  Mark Hansen was last seen in our office in 2019 for similar diagnosis.  At that time he was found to have classic Raynaud's.  ABIs were normal, toe pressures acceptable.  He denied symptoms of claudication, ischemic rest pain, tissue loss.  On exam today, Mark Hansen was doing well.  Originally from [place], he immig[place] in the [place]s.  He is now retired.  The new claudication symptoms he described occur after roughly 4 minutes of ambulation.  Calf pain resolves with rest.  Prior to 3 weeks ago, he was ambulating roughly a mile on a weekly basis. Mark Hansen denies ischemic rest pain, tissue loss.   Past Medical History:  Diagnosis Date   Anxiety    Back pain    GERD (gastroesophageal reflux disease)    Hip fracture (HCC) 02/05/2017   left   Peripheral vascular disease (HCC)    Prostate disease    Thrombocytopenia (HCC) 02/05/2017    Past Surgical History:  Procedure Laterality Date   ANKLE SURGERY     bilateral ankle surgery   COLONOSCOPY  01/07/2011   FEMUR IM NAIL Left 02/05/2017   Procedure: INTRAMEDULLARY (IM) NAIL FEMORAL LEFT;  Surgeon: Yolonda Kida, MD;  Location: [facility];  Service: Orthopedics;  Laterality: Left;   OTHER SURGICAL HISTORY     unspecified type of prostate surgery    Social History   Socioeconomic History   Marital status: Widowed    Spouse name: Not on file   Number of children: Not on file   Years of education: Not on file   Highest education level: Not on file  Occupational History   Occupation: Disabled    Comment: secondary to accident at work several years ago.  Tobacco Use   Smoking status: Former    Packs/day: 1.00    Years: 35.00    Total pack years: 35.00    Types: Cigarettes    Smokeless tobacco: Never  Vaping Use   Vaping Use: Never used  Substance and Sexual Activity   Alcohol use: No    Comment: Former heavy EtOH use, denies use in past 10 years.   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    Admitted to [facility]   Widowed, one daughter, two sons   Former smoker   Alcohol none   Full Code.    Social Determinants of Health   Financial Resource Strain: Not on file  Food Insecurity: Not on file  Transportation Needs: Not on file  Physical Activity: Not on file  Stress: Not on file  Social Connections: Not on file  Intimate Partner Violence: Not on file    Family History  Problem Relation Age of Onset   CAD Brother 12       requiring bypass   CAD Mother 72   Asthma Mother    Alzheimer's disease Father    Emphysema Father    Kidney disease Unknown        cousin    Current Outpatient Medications  Medication Sig Dispense Refill  bisacodyl (DULCOLAX) 5 MG EC tablet Take 1 tablet (5 mg total) by mouth daily. 30 tablet 0   ferrous sulfate 325 (65 FE) MG tablet Take 1 tablet (325 mg total) by mouth 2 (two) times daily with a meal. (Patient not taking: Reported on 02/22/2018)     ibuprofen (ADVIL,MOTRIN) 400 MG tablet Take 1 tablet (400 mg total) by mouth every 8 (eight) hours as needed for moderate pain. (Patient not taking: Reported on 02/22/2018)     methocarbamol (ROBAXIN) 500 MG tablet Take 500 mg by mouth. Take one tablet every 6 hours as needed for muscle spasms     pantoprazole (PROTONIX) 40 MG tablet Take 1 tablet (40 mg total) by mouth daily. 30 tablet 1   polyethylene glycol (MIRALAX / GLYCOLAX) packet Take 17 g by mouth daily. (Patient not taking: Reported on 02/22/2018) 14 each 0   vitamin E 1000 UNIT capsule Take 1,000 Units by mouth daily.     No current  facility-administered medications for this visit.    No Known Allergies   REVIEW OF SYSTEMS:   [X]  denotes positive finding, [ ]  denotes negative finding Cardiac  Comments:  Chest pain or chest pressure:    Shortness of breath upon exertion:    Short of breath when lying flat:    Irregular heart rhythm:        Vascular    Pain in calf, thigh, or hip brought on by ambulation:    Pain in feet at night that wakes you up from your sleep:     Blood clot in your veins:    Leg swelling:         Pulmonary    Oxygen at home:    Productive cough:     Wheezing:         Neurologic    Sudden weakness in arms or legs:     Sudden numbness in arms or legs:     Sudden onset of difficulty speaking or slurred speech:    Temporary loss of vision in one eye:     Problems with dizziness:         Gastrointestinal    Blood in stool:     Vomited blood:         Genitourinary    Burning when urinating:     Blood in urine:        Psychiatric    Major depression:         Hematologic    Bleeding problems:    Problems with blood clotting too easily:        Skin    Rashes or ulcers:        Constitutional    Fever or chills:      PHYSICAL EXAMINATION:  There were no vitals filed for this visit.  General:  WDWN in NAD; vital signs documented above Gait: Not observed HENT: WNL, normocephalic Pulmonary: normal non-labored breathing , without wheezing Cardiac: regular HR Abdomen: soft, NT, no masses Skin: without rashes Vascular Exam/Pulses:  Right Left  Radial 2+ (normal) 2+ (normal)  Ulnar 2+ (normal) 2+ (normal)  Femoral 2+ 2+  Popliteal    DP 2+ (normal) 2+ (normal)  PT     Extremities: with ischemic changes, without Gangrene , without cellulitis; without open wounds;  Musculoskeletal: no muscle wasting or atrophy  Neurologic: A&O X 3;  No focal weakness or paresthesias are detected Psychiatric:  The pt has Normal affect.   Non-Invasive Vascular Imaging:  ABI  Findings:  +---------+------------------+-----+----------+--------+  Right    Rt Pressure (mmHg)IndexWaveform  Comment   +---------+------------------+-----+----------+--------+  Brachial 164                                        +---------+------------------+-----+----------+--------+  PTA      144               0.88 monophasic          +---------+------------------+-----+----------+--------+  DP       157               0.96 monophasic          +---------+------------------+-----+----------+--------+  Great Toe35                0.21 Abnormal            +---------+------------------+-----+----------+--------+   +---------+------------------+-----+----------+-------+  Left     Lt Pressure (mmHg)IndexWaveform  Comment  +---------+------------------+-----+----------+-------+  Brachial 160                                       +---------+------------------+-----+----------+-------+  PTA      137               0.84 monophasic         +---------+------------------+-----+----------+-------+  DP       152               0.93 biphasic           +---------+------------------+-----+----------+-------+  Great Toe31                0.19 Abnormal           +---------+------------------+-----+----------+-------+     ASSESSMENT/PLAN: Mark Hansen is a 76 y.o. male presenting with location symptoms in bilateral lower extremities with known history of classic Raynaud's.  ABIs were reviewed demonstrating moderate peripheral arterial disease bilaterally with depressed toe pressures.  On physical exam, there was mottling of the toes, which has been described previously for Mark Hansen.  Had a palpable pulse in the dorsalis pedis artery bilaterally.  Mark Hansen and I had a long discussion regarding the above.  He would benefit from continued ambulation.  I have also written a prescription for calcium channel blocker in an effort to improve his Raynaud's.  His  presentation is somewhat perplexing claudication symptoms only recently occurred and appear to be relatively advanced.  If the mottling of his toes were new, I would proceed with an embolic work-up.  Being that these have been present for some time, I plan to continue the treatment course of normal claudication.  In evaluating the ABI, I think the depressed toe pressure could be from vasospasm associated with Raynaud's.    I asked that he call my office should rest pain occur or should any wounds develop on the feet.    [name], MD [facility] [phone]

## 2021-12-26 ENCOUNTER — Ambulatory Visit (INDEPENDENT_AMBULATORY_CARE_PROVIDER_SITE_OTHER): Payer: Medicare (Managed Care) | Admitting: Vascular Surgery

## 2021-12-26 ENCOUNTER — Ambulatory Visit (HOSPITAL_COMMUNITY)
Admission: RE | Admit: 2021-12-26 | Discharge: 2021-12-26 | Disposition: A | Discharge status: Home / Self Care | Payer: Medicare (Managed Care) | Source: Ambulatory Visit | Attending: Vascular Surgery | Admitting: Vascular Surgery

## 2021-12-26 ENCOUNTER — Encounter: Payer: Self-pay | Admitting: Vascular Surgery

## 2021-12-26 ENCOUNTER — Other Ambulatory Visit: Payer: Self-pay

## 2021-12-26 VITALS — BP 168/81 | HR 60 | Temp 97.8°F | Resp 16 | Ht 63.0 in | Wt 113.0 lb

## 2021-12-26 DIAGNOSIS — I739 Peripheral vascular disease, unspecified: Secondary | ICD-10-CM | POA: Diagnosis not present

## 2021-12-26 MED ORDER — NIFEDIPINE ER OSMOTIC RELEASE 30 MG PO TB24: 30.0000 mg | ORAL_TABLET | Freq: Every day | ORAL | 0 refills | Status: AC

## 2021-12-26 MED ORDER — NIFEDIPINE ER OSMOTIC RELEASE 30 MG PO TB24: 30.0000 mg | ORAL_TABLET | Freq: Every day | ORAL | Status: DC

## 2022-01-01 ENCOUNTER — Other Ambulatory Visit: Payer: Self-pay

## 2022-01-01 DIAGNOSIS — I739 Peripheral vascular disease, unspecified: Secondary | ICD-10-CM

## 2022-01-20 DIAGNOSIS — R0602 Shortness of breath: Secondary | ICD-10-CM | POA: Diagnosis not present

## 2022-01-27 ENCOUNTER — Ambulatory Visit: Payer: PPO | Admitting: Cardiology

## 2022-01-27 ENCOUNTER — Encounter: Payer: Self-pay | Admitting: Cardiology

## 2022-01-27 VITALS — BP 150/77 | HR 90 | Temp 98.0°F | Resp 17 | Ht 63.0 in | Wt 117.0 lb

## 2022-01-27 DIAGNOSIS — E782 Mixed hyperlipidemia: Secondary | ICD-10-CM

## 2022-01-27 DIAGNOSIS — R0609 Other forms of dyspnea: Secondary | ICD-10-CM

## 2022-01-27 DIAGNOSIS — R072 Precordial pain: Secondary | ICD-10-CM

## 2022-01-27 DIAGNOSIS — Z87891 Personal history of nicotine dependence: Secondary | ICD-10-CM | POA: Diagnosis not present

## 2022-01-27 DIAGNOSIS — I739 Peripheral vascular disease, unspecified: Secondary | ICD-10-CM | POA: Diagnosis not present

## 2022-01-27 DIAGNOSIS — I10 Essential (primary) hypertension: Secondary | ICD-10-CM

## 2022-01-27 MED ORDER — ASPIRIN 81 MG PO TBEC: 81.0000 mg | DELAYED_RELEASE_TABLET | Freq: Every day | ORAL | 12 refills | Status: DC

## 2022-01-27 MED ORDER — ROSUVASTATIN CALCIUM 20 MG PO TABS: 20.0000 mg | ORAL_TABLET | Freq: Every evening | ORAL | 0 refills | Status: AC

## 2022-01-27 MED ORDER — LOSARTAN POTASSIUM 25 MG PO TABS: 25.0000 mg | ORAL_TABLET | ORAL | 0 refills | Status: DC

## 2022-01-27 NOTE — Progress Notes (Signed)
ID:  Mark Hansen, DOB 1945-09-22, MRN [id number]  PCP:  Lucianne Lei, MD  Cardiologist:  Rex Kras, DO, [facility] (established care 01/27/2022)  REASON FOR CONSULT: Chest Pain   REQUESTING PHYSICIAN:  Lucianne Lei, [address] [place],  [place] [address]  Chief Complaint  Patient presents with   Chest Pain   New Patient (Initial Visit)    HPI  Mark Hansen is a 76 y.o. [place] male who presents to the clinic for evaluation of chest pain at the request of Lucianne Lei, MD. His past medical history and cardiovascular risk factors include: Former smoker, Raynauds's phenomenon (per patient), HTN.  Patient presents to the office for evaluation of chest heaviness and shortness of breath.  Patient states that the symptoms are usually pronounced with walking, better with resting and relaxing.  Ongoing for the last 2 months at least.  The discomfort is substernally located worse in intensity 6 out of 10 (currently 0 out of 10), nonradiating.  Not associated diaphoresis nausea or vomiting.  He also has multiple other nonspecific discomfort such as hip pain bilaterally leg pain.  FUNCTIONAL STATUS: Walks 8-10 minutes in the house; otherwise no structured exercise program or daily routine.   ALLERGIES: No Known Allergies  MEDICATION LIST PRIOR TO VISIT: Current Meds  Medication Sig   aspirin EC 81 MG tablet Take 1 tablet (81 mg total) by mouth daily. Swallow whole.   ibuprofen (ADVIL,MOTRIN) 400 MG tablet Take 1 tablet (400 mg total) by mouth every 8 (eight) hours as needed for moderate pain.   losartan (COZAAR) 25 MG tablet Take 1 tablet (25 mg total) by mouth every morning.   NIFEdipine (PROCARDIA-XL/NIFEDICAL-XL) 30 MG 24 hr tablet Take 1 tablet (30 mg total) by mouth daily.   rosuvastatin (CRESTOR) 20 MG tablet Take 1 tablet (20 mg total) by mouth at bedtime.     PAST MEDICAL HISTORY: Past Medical History:  Diagnosis Date   Anxiety    Back pain    GERD  (gastroesophageal reflux disease)    Hip fracture ([place]) 02/05/2017   left   Peripheral vascular disease ([place])    Prostate disease    Thrombocytopenia ([place]) 02/05/2017    PAST SURGICAL HISTORY: Past Surgical History:  Procedure Laterality Date   ANKLE SURGERY     bilateral ankle surgery   COLONOSCOPY  01/07/2011   FEMUR IM NAIL Left 02/05/2017   Procedure: INTRAMEDULLARY (IM) NAIL FEMORAL LEFT;  Surgeon: Nicholes Stairs, MD;  Location: [facility];  Service: Orthopedics;  Laterality: Left;   OTHER SURGICAL HISTORY     unspecified type of prostate surgery    FAMILY HISTORY: The patient family history includes Alzheimer's disease in his father; Asthma in his father and mother; CAD (age of onset: 33) in his brother; CAD (age of onset: 51) in his mother; Emphysema in his father; Kidney disease in an other family member.  SOCIAL HISTORY:  The patient  reports that he quit smoking about 13 years ago. His smoking use included cigarettes. He has a 35.00 pack-year smoking history. He has never used smokeless tobacco. He reports that he does not drink alcohol and does not use drugs.  REVIEW OF SYSTEMS: Review of Systems  Cardiovascular:  Positive for chest pain and dyspnea on exertion. Negative for claudication, irregular heartbeat, leg swelling, near-syncope, orthopnea, palpitations, paroxysmal nocturnal dyspnea and syncope.  Respiratory:  Negative for shortness of breath.   Hematologic/Lymphatic: Negative for bleeding problem.  Musculoskeletal:  Positive for arthritis  and joint pain (hip bilateral). Negative for muscle cramps and myalgias.  Neurological:  Negative for dizziness and light-headedness.    PHYSICAL EXAM:    01/27/2022    8:45 AM 01/27/2022    8:32 AM 12/26/2021    9:54 AM  Vitals with BMI  Height  5\' 3"  5\' 3"   Weight  117 lbs 113 lbs  BMI  20.73 20.02  Systolic 150 150  Diastolic 77 104 81  Pulse 90 95 60    Physical Exam  Constitutional: No distress.  Age  appropriate, hemodynamically stable.   Neck: No JVD present.  Cardiovascular: Normal rate, regular rhythm, S1 normal, S2 normal and intact distal pulses. Exam reveals no gallop, no S3 and no S4.  No murmur heard. Pulses:      Radial pulses are 2+ on the right side and 2+ on the left side.       Dorsalis pedis pulses are 1+ on the right side and 1+ on the left side.       Posterior tibial pulses are 0 on the right side and 0 on the left side.  Pulmonary/Chest: Effort normal and breath sounds normal. No stridor. He has no wheezes. He has no rales.  Abdominal: Soft. Bowel sounds are normal. He exhibits no distension. There is no abdominal tenderness.  Musculoskeletal:        General: No edema.     Cervical back: Neck supple.  Neurological: He is alert and oriented to person, place, and time. He has intact cranial nerves (2-12).  Skin: Skin is warm and moist.   CARDIAC DATABASE: EKG: 01/27/2022: Normal sinus rhythm, 83 bpm, without underlying ischemia or injury pattern.  Echocardiogram: No results found for this or any previous visit from the past 1095 days.    Stress Testing: No results found for this or any previous visit from the past 1095 days.   Heart Catheterization: None  ABI w/wo TBI:  12/26/2021 Right: Although resting right ankle-brachial index appears to be within normal range, may be falsely elevated. Monophasic waveforms noted, The right toe-brachial index is abnormal.   Left: Resting left ankle-brachial index indicates mild left lower  extremity arterial disease. The left toe-brachial index is abnormal.   LABORATORY DATA: External Labs: Collected: 12/17/2021 provided by referring physician. BNP 48. D-dimer 0.75. Total cholesterol 195, HDL 58, triglycerides 97, LDL 117, non-HDL 137. BUN 20, creatinine 0.96. Sodium 142, potassium 4.2, chloride 103, bicarb 30 AST 19, ALT 13, alkaline phosphatase 79. Hemoglobin 17.1 g/dL, hematocrit 02/25/2022. PSA total 6.58 (normal <4  ng/mL)   IMPRESSION:    ICD-10-CM   1. Precordial pain  R07.2 EKG 12-Lead    PCV ECHOCARDIOGRAM COMPLETE    PCV MYOCARDIAL PERFUSION WO LEXISCAN    CT CARDIAC SCORING ([facility] LOCATIONS ONLY)    2. Dyspnea on exertion  R06.09 PCV ECHOCARDIOGRAM COMPLETE    PCV MYOCARDIAL PERFUSION WO LEXISCAN    CT CARDIAC SCORING ([facility] LOCATIONS ONLY)    3. Peripheral vascular disease (HCC)  I73.9 aspirin EC 81 MG tablet    rosuvastatin (CRESTOR) 20 MG tablet    4. Mixed hyperlipidemia  E78.2 rosuvastatin (CRESTOR) 20 MG tablet    5. Former smoker  Z87.891     6. Benign hypertension  I10 losartan (COZAAR) 25 MG tablet    Basic metabolic panel       RECOMMENDATIONS: Wayne Wicklund is a 76 y.o. [place] Jester male whose past medical history and cardiac risk factors include: Former smoker, Raynauds's phenomenon (per  patient), HTN.  Precordial pain / Dyspnea on exertion Possible cardiac etiology. EKG nonischemic. Echo will be ordered to evaluate for structural heart disease and left ventricular systolic function. Exercise nuclear stress test to evaluate for functional capacity, reversible ischemia.  EKG is interpretable and patient is able to exercise. Aspirin and statin therapy. Educated on seeking medical attention sooner by going to the closest ER via EMS if the symptoms increase in intensity, frequency, duration, or has typical chest pain as discussed in the office.  Patient verbalized understanding. Coronary artery calcium score for further risk stratification.  Peripheral vascular disease (HCC) Abnormal ABIs in the past. Diminished pulses on physical examination. Currently on nifedipine per vascular surgery according to the patient Follows with vascular surgery. Reemphasized importance of complete smoking cessation. We will start aspirin and statin therapy.  Mixed hyperlipidemia Indexed LDL 117 mg/dL. Start rosuvastatin 20 mg p.o. nightly.  Labs in 6 weeks to evaluate lipid profile (still  needs to be ordered) He denies myalgia or other side effects. Most recent lipids dated August 2023, independently reviewed as noted above.  Benign hypertension Office blood pressures are not well controlled. Patient states that he stopped taking his lisinopril as he is coming to see [place]. Reemphasized importance of blood pressure management. We will start losartan and labs in 1 week to evaluate kidney function and electrolytes Reemphasized the importance of a low-salt diet.  Data Reviewed: I have independently reviewed external notes provided by the referring provider as part of this office visit.   I have independently reviewed labs, ekg, vascular progress note (most recent) as part of medical decision making. I have ordered the following tests:  Orders Placed This Encounter  Procedures   CT CARDIAC SCORING ([facility] LOCATIONS ONLY)    Standing Status:   Future    Standing Expiration Date:   01/28/2023    Order Specific Question:   Preferred imaging location?    Answer:   [facility]    Order Specific Question:   Release to patient    Answer:   Immediate   Basic metabolic panel    Standing Status:   Future    Standing Expiration Date:   01/28/2023   PCV MYOCARDIAL PERFUSION WO LEXISCAN    Standing Status:   Future    Standing Expiration Date:   01/28/2023   EKG 12-Lead   PCV ECHOCARDIOGRAM COMPLETE    Standing Status:   Future    Standing Expiration Date:   01/28/2023   I have made medications changes at today's encounter as noted above.  FINAL MEDICATION LIST END OF ENCOUNTER: Meds ordered this encounter  Medications   losartan (COZAAR) 25 MG tablet    Sig: Take 1 tablet (25 mg total) by mouth every morning.    Dispense:  30 tablet    Refill:  0   aspirin EC 81 MG tablet    Sig: Take 1 tablet (81 mg total) by mouth daily. Swallow whole.    Dispense:  30 tablet    Refill:  12   rosuvastatin (CRESTOR) 20 MG tablet    Sig: Take 1 tablet (20 mg total) by mouth at bedtime.    Dispense:   90 tablet    Refill:  0    Medications Discontinued During This Encounter  Medication Reason   bisacodyl (DULCOLAX) 5 MG EC tablet    ferrous sulfate 325 (65 FE) MG tablet    lisinopril (ZESTRIL) 10 MG tablet    methocarbamol (ROBAXIN) 500 MG tablet  polyethylene glycol (MIRALAX / GLYCOLAX) packet    vitamin E 1000 UNIT capsule    pantoprazole (PROTONIX) 40 MG tablet      Current Outpatient Medications:    aspirin EC 81 MG tablet, Take 1 tablet (81 mg total) by mouth daily. Swallow whole., Disp: 30 tablet, Rfl: 12   ibuprofen (ADVIL,MOTRIN) 400 MG tablet, Take 1 tablet (400 mg total) by mouth every 8 (eight) hours as needed for moderate pain., Disp: , Rfl:    losartan (COZAAR) 25 MG tablet, Take 1 tablet (25 mg total) by mouth every morning., Disp: 30 tablet, Rfl: 0   NIFEdipine (PROCARDIA-XL/NIFEDICAL-XL) 30 MG 24 hr tablet, Take 1 tablet (30 mg total) by mouth daily., Disp: 90 tablet, Rfl: 0   rosuvastatin (CRESTOR) 20 MG tablet, Take 1 tablet (20 mg total) by mouth at bedtime., Disp: 90 tablet, Rfl: 0  Orders Placed This Encounter  Procedures   CT CARDIAC SCORING ([facility] LOCATIONS ONLY)   Basic metabolic panel   PCV MYOCARDIAL PERFUSION WO LEXISCAN   EKG 12-Lead   PCV ECHOCARDIOGRAM COMPLETE    There are no Patient Instructions on file for this visit.   --Continue cardiac medications as reconciled in final medication list. --Return in about 4 weeks (around 02/24/2022) for Reevaluation of, Chest pain, Review test results. or sooner if needed. --Continue follow-up with your primary care physician regarding the management of your other chronic comorbid conditions.  Patient's questions and concerns were addressed to his satisfaction. He voices understanding of the instructions provided during this encounter.   This note was created using a voice recognition software as a result there may be grammatical errors inadvertently enclosed that do not reflect the nature of this  encounter. Every attempt is made to correct such errors.  Tessa Lerner, [place], [facility]  Pager: [phone] Office: [phone]

## 2022-01-29 ENCOUNTER — Ambulatory Visit (INDEPENDENT_AMBULATORY_CARE_PROVIDER_SITE_OTHER): Payer: PPO | Admitting: Pulmonary Disease

## 2022-01-29 ENCOUNTER — Encounter: Payer: Self-pay | Admitting: Pulmonary Disease

## 2022-01-29 VITALS — BP 128/60 | HR 84 | Ht 63.0 in | Wt 113.6 lb

## 2022-01-29 DIAGNOSIS — R0609 Other forms of dyspnea: Secondary | ICD-10-CM | POA: Diagnosis not present

## 2022-01-29 DIAGNOSIS — J432 Centrilobular emphysema: Secondary | ICD-10-CM | POA: Diagnosis not present

## 2022-01-29 MED ORDER — TRELEGY ELLIPTA 100-62.5-25 MCG/ACT IN AEPB: 1.0000 | INHALATION_SPRAY | Freq: Every day | RESPIRATORY_TRACT | 0 refills | Status: DC

## 2022-01-29 MED ORDER — TRELEGY ELLIPTA 100-62.5-25 MCG/ACT IN AEPB: 1.0000 | INHALATION_SPRAY | Freq: Every day | RESPIRATORY_TRACT | 3 refills | Status: DC

## 2022-01-29 NOTE — Patient Instructions (Signed)
Nice to meet you  I think some your shortness of breath is related to damage from cigarette smoking.  There is signs of this on your chest x-rays and CT scans in the past.  To treat this aggressively I recommend you in using Trelegy 1 puff once a day.  Use it every day.  First thing in the morning.  Rinse your mouth out with water after every use.  I will prescribe this today.  Also provided a sample that will be good for 14 days.  To further investigate your symptoms I recommend pulmonary function test I ordered these today.  We will try to get the scheduled for you at the front desk sometime in the next few weeks.  Return to clinic in 3 months or sooner if needed with Dr. Silas Flood

## 2022-01-29 NOTE — Progress Notes (Signed)
@Patient  ID: , male    DOB: 09-Dec-1945, 76 y.o.   MRN: [id number]  Chief Complaint  Patient presents with   Consult    Consult for SOB. Pt states the for about 2-3 months now he started feeling SOB when he is up walking around. But when he stops and rest he is fine after a few minutes. He was given a sample of Trelgy but he states that he did not take it.     Referring provider: [id number], MD  HPI:   76 y.o. man whom are seen in consultation for evaluation of dyspnea on exertion.  Note from referring provider reviewed.  Most recent cardiology note reviewed.  Most recent vascular surgery note reviewed.  Patient was usual state of health.  About 3 to 4 months ago noted some dyspnea on exertion.  Out of the blue.  No triggering event that he can identify.  No time of day when things are better or worse.  No position makes it better or worse.  No seasonal environmental factors he can identify that may have triggered makes things better or worse.  He was given Trelegy.  He used the samples.  He felt it helped his breathing, feel more energy etc.  He has since run out of this.  No other relieving or exacerbating factors.  Review of chest imaging reveals CTA PE protocol 05/2012 that reveals emphysema but otherwise clear lungs on my review interpretation.  Chest x-ray 01/2017 appears hyperinflated with clear lungs bilaterally on my review and interpretation.  Chest x-ray 11/2021 appears hyperinflated with clear lungs bilaterally on my review and interpretation.  PMH: Hyperlipidemia, tobacco abuse in remission, peripheral vascular disease Surgical history: Ankle surgery, leg fracture surgery Family history: Mother with CAD, asthma, probable asthma Social history: Former smoker, 35-pack-year, quit 2010, lives in [place] / Pulmonary Flowsheets:   ACT:      No data to display          MMRC:     No data to display          Epworth:      No data to  display          Tests:   FENO:  No results found for: "NITRICOXIDE"  PFT:     No data to display          WALK:      No data to display          Imaging: Personally reviewed and as per EMR discussion this note No results found.  Lab Results: Personally reviewed CBC    Component Value Date/Time   WBC 7.8 02/07/2017 0527   RBC 3.43 (L) 02/07/2017 0527   HGB 10.3 (L) 02/08/2017 0554   HCT 29.4 (L) 02/08/2017 0554   PLT 120 (L) 02/07/2017 0527   MCV 86.9 02/07/2017 0527   MCH 29.7 02/07/2017 0527   MCHC 34.2 02/07/2017 0527   RDW 12.8 02/07/2017 0527   LYMPHSABS 1.3 06/19/2012 1352   MONOABS 0.3 06/19/2012 1352   EOSABS 0.1 06/19/2012 1352   BASOSABS 0.0 06/19/2012 1352    BMET    Component Value Date/Time   NA 140 02/07/2017 0527   K 3.8 02/07/2017 0527   CL 105 02/07/2017 0527   CO2 31 02/07/2017 0527   GLUCOSE 105 (H) 02/07/2017 0527   BUN 14 02/07/2017 0527   CREATININE 0.88 02/07/2017 0527   CALCIUM 7.8 (L) 02/07/2017 0527   GFRNONAA >60 02/07/2017  [place] >60 02/07/2017 0527    BNP No results found for: "BNP"  ProBNP No results found for: "PROBNP"  Specialty Problems   None   No Known Allergies  Immunization History  Administered Date(s) Administered   Pneumococcal-Unspecified 04/27/2010   Tdap 04/27/1964    Past Medical History:  Diagnosis Date   Anxiety    Back pain    GERD (gastroesophageal reflux disease)    Hip fracture ([place]) 02/05/2017   left   Peripheral vascular disease (HCC)    Prostate disease    Thrombocytopenia ([place]) 02/05/2017    Tobacco History: Social History   Tobacco Use  Smoking Status Former   Packs/day: 1.00   Years: 35.00   Total pack years: 35.00   Types: Cigarettes   Quit date: 2010   Years since quitting: 13.7  Smokeless Tobacco Never   Counseling given: Not Answered   Continue to not smoke  Outpatient Encounter Medications as of 01/29/2022  Medication Sig   aspirin EC 81  MG tablet Take 1 tablet (81 mg total) by mouth daily. Swallow whole.   Fluticasone-Umeclidin-Vilant (TRELEGY ELLIPTA) 100-62.5-25 MCG/ACT AEPB Inhale 1 puff into the lungs daily.   ibuprofen (ADVIL,MOTRIN) 400 MG tablet Take 1 tablet (400 mg total) by mouth every 8 (eight) hours as needed for moderate pain.   losartan (COZAAR) 25 MG tablet Take 1 tablet (25 mg total) by mouth every morning.   NIFEdipine (PROCARDIA-XL/NIFEDICAL-XL) 30 MG 24 hr tablet Take 1 tablet (30 mg total) by mouth daily.   rosuvastatin (CRESTOR) 20 MG tablet Take 1 tablet (20 mg total) by mouth at bedtime.   No facility-administered encounter medications on file as of 01/29/2022.     Review of Systems  Review of Systems  No chest pain with exertion.  No orthopnea or PND.  Comprehensive review of systems otherwise negative. Physical Exam  BP 128/60 (BP Location: Left Arm, Patient Position: Sitting, Cuff Size: Normal)   Pulse 84   Ht 5\' 3"  (1.6 m)   Wt 113 lb 9.6 oz (51.5 kg)   SpO2 97%   BMI 20.12 kg/m   Wt Readings from Last 5 Encounters:  01/29/22 113 lb 9.6 oz (51.5 kg)  01/27/22 117 lb (53.1 kg)  12/26/21 113 lb (51.3 kg)  02/22/18 113 lb 6.4 oz (51.4 kg)  02/24/17 121 lb (54.9 kg)    BMI Readings from Last 5 Encounters:  01/29/22 20.12 kg/m  01/27/22 20.73 kg/m  12/26/21 20.02 kg/m  02/22/18 20.74 kg/m  02/24/17 22.13 kg/m     Physical Exam General: Sitting in exam chair, no acute distress Eyes: EOMI, no icterus Neck: Supple, no JVP Pulmonary: Clear, distant Cardiovascular: Warm, no edema Abdomen: Nondistended, bowel sounds present MSK: No synovitis, no joint effusion Neuro: Normal gait, no weakness Psych: Normal mood, full affect   Assessment & Plan:   Dyspnea on exertion: New over the last 3 months or so.  Chest x-ray is hyperinflated.  Emphysema on prior CT scan.  High suspicion for obstructive lung disease.  Likely cigarette related.  PFTs for further evaluation.  Trelegy has  helped some.  This was prescribed today, 100 mcg dose, 1 puff daily.  TTE scheduled, ordered by cardiologist.  Leg pain: Bilaterally, in the ankles and legs higher up.  Sounds like claudication.  Has ongoing outpatient evaluation with vascular surgery.   Return in about 3 months (around 05/01/2022).   Lanier Clam, MD 01/29/2022

## 2022-01-30 ENCOUNTER — Ambulatory Visit: Payer: PPO

## 2022-01-30 DIAGNOSIS — R072 Precordial pain: Secondary | ICD-10-CM | POA: Diagnosis not present

## 2022-01-30 DIAGNOSIS — R0609 Other forms of dyspnea: Secondary | ICD-10-CM

## 2022-02-05 ENCOUNTER — Ambulatory Visit: Payer: PPO

## 2022-02-05 DIAGNOSIS — R072 Precordial pain: Secondary | ICD-10-CM | POA: Diagnosis not present

## 2022-02-05 DIAGNOSIS — R0609 Other forms of dyspnea: Secondary | ICD-10-CM

## 2022-02-09 NOTE — Progress Notes (Signed)
Called and spoke with patient, appointment confirmed.

## 2022-02-09 NOTE — Progress Notes (Signed)
CT Cardiac Scoring: 02/02/2022 at [facility] Left Main: 0. LVEDP 25. Circumflex: 16. RCA 63 PDA: 0 Total CAC 104 AU, 0-25th percentile for patient's age and gender based on [place] database. Noncardiac findings: Emphysematous changes.  We will review the results at the upcoming office visit.  Yaneisy Wenz [place], DO, [facility]

## 2022-02-09 NOTE — Progress Notes (Signed)
Called and spoke with patient, appointment confirmed.

## 2022-02-10 DIAGNOSIS — R0602 Shortness of breath: Secondary | ICD-10-CM | POA: Diagnosis not present

## 2022-02-10 DIAGNOSIS — I739 Peripheral vascular disease, unspecified: Secondary | ICD-10-CM | POA: Diagnosis not present

## 2022-02-17 NOTE — Progress Notes (Signed)
Called patient to inform him about his stress test. Patient understood.

## 2022-02-24 ENCOUNTER — Ambulatory Visit: Payer: PPO | Admitting: Cardiology

## 2022-02-24 ENCOUNTER — Encounter: Payer: Self-pay | Admitting: Cardiology

## 2022-02-24 VITALS — BP 146/74 | HR 67 | Temp 97.7°F | Resp 16 | Ht 63.0 in | Wt 116.0 lb

## 2022-02-24 DIAGNOSIS — R0609 Other forms of dyspnea: Secondary | ICD-10-CM

## 2022-02-24 DIAGNOSIS — I10 Essential (primary) hypertension: Secondary | ICD-10-CM

## 2022-02-24 DIAGNOSIS — I2584 Coronary atherosclerosis due to calcified coronary lesion: Secondary | ICD-10-CM | POA: Diagnosis not present

## 2022-02-24 DIAGNOSIS — E782 Mixed hyperlipidemia: Secondary | ICD-10-CM | POA: Diagnosis not present

## 2022-02-24 DIAGNOSIS — I739 Peripheral vascular disease, unspecified: Secondary | ICD-10-CM | POA: Diagnosis not present

## 2022-02-24 DIAGNOSIS — I2510 Atherosclerotic heart disease of native coronary artery without angina pectoris: Secondary | ICD-10-CM

## 2022-02-24 DIAGNOSIS — Z87891 Personal history of nicotine dependence: Secondary | ICD-10-CM

## 2022-02-24 MED ORDER — LOSARTAN POTASSIUM 50 MG PO TABS: 50.0000 mg | ORAL_TABLET | ORAL | 0 refills | Status: AC

## 2022-02-24 NOTE — Progress Notes (Signed)
ID:  Mark Hansen, DOB March 06, 1946, MRN [id number]  PCP:  Lucianne Lei, MD  Cardiologist:  Rex Kras, DO, [facility] (established care 01/27/2022)  Date: 02/24/22 Last Office Visit: 01/27/2022  Chief Complaint  Patient presents with   Follow-up    Reevaluation of chest tightness/shortness of breath. Discussed test results    HPI  Mark Hansen is a 76 y.o. [place] male whose past medical history and cardiovascular risk factors include: Moderate CAC (104AU), Former smoker, Raynauds's phenomenon (per patient), HTN.  Patient referred to the practice for chest pain/heaviness.  His symptoms were concerning for possible cardiac etiology and underwent an echo and stress test.  And now here for follow-up.  Echo noted preserved LVEF, moderate CAC per calcium score, and exercise nuclear stress test was overall low risk study.  Since last office visit patient states that the chest tightness has resolved.  But he continues to have shortness of breath predominantly with effort related activities.  He was started on losartan at the last office visit.  His blood pressure was elevated during his stress test.  Given his PAD, index LDL of 117 mg/dL, he was started on rosuvastatin 20 mg p.o. nightly.  His 6-week follow-up lipids are still pending.  FUNCTIONAL STATUS: Walks 8-10 minutes in the house; otherwise no structured exercise program or daily routine.   ALLERGIES: No Known Allergies  MEDICATION LIST PRIOR TO VISIT: Current Meds  Medication Sig   aspirin EC 81 MG tablet Take 1 tablet (81 mg total) by mouth daily. Swallow whole.   Fluticasone-Umeclidin-Vilant (TRELEGY ELLIPTA) 100-62.5-25 MCG/ACT AEPB Inhale 1 puff into the lungs daily.   ibuprofen (ADVIL,MOTRIN) 400 MG tablet Take 1 tablet (400 mg total) by mouth every 8 (eight) hours as needed for moderate pain.   NIFEdipine (PROCARDIA-XL/NIFEDICAL-XL) 30 MG 24 hr tablet Take 1 tablet (30 mg total) by mouth daily.   rosuvastatin (CRESTOR) 20  MG tablet Take 1 tablet (20 mg total) by mouth at bedtime.   [DISCONTINUED] losartan (COZAAR) 25 MG tablet Take 1 tablet (25 mg total) by mouth every morning.     PAST MEDICAL HISTORY: Past Medical History:  Diagnosis Date   Anxiety    Back pain    GERD (gastroesophageal reflux disease)    Hip fracture ([place]) 02/05/2017   left   Peripheral vascular disease ([place])    Prostate disease    Thrombocytopenia ([place]) 02/05/2017    PAST SURGICAL HISTORY: Past Surgical History:  Procedure Laterality Date   ANKLE SURGERY     bilateral ankle surgery   COLONOSCOPY  01/07/2011   FEMUR IM NAIL Left 02/05/2017   Procedure: INTRAMEDULLARY (IM) NAIL FEMORAL LEFT;  Surgeon: Nicholes Stairs, MD;  Location: [facility];  Service: Orthopedics;  Laterality: Left;   OTHER SURGICAL HISTORY     unspecified type of prostate surgery    FAMILY HISTORY: The patient family history includes Alzheimer's disease in his father; Asthma in his father and mother; CAD (age of onset: 71) in his brother; CAD (age of onset: 73) in his mother; Emphysema in his father; Kidney disease in an other family member.  SOCIAL HISTORY:  The patient  reports that he quit smoking about 13 years ago. His smoking use included cigarettes. He has a 35.00 pack-year smoking history. He has never used smokeless tobacco. He reports that he does not drink alcohol and does not use drugs.  REVIEW OF SYSTEMS: Review of Systems  Cardiovascular:  Positive for dyspnea on exertion (Chronic and stable). Negative for  chest pain, claudication, irregular heartbeat, leg swelling, near-syncope, orthopnea, palpitations, paroxysmal nocturnal dyspnea and syncope.  Respiratory:  Negative for shortness of breath.   Hematologic/Lymphatic: Negative for bleeding problem.  Musculoskeletal:  Positive for arthritis and joint pain (hip bilateral). Negative for muscle cramps and myalgias.  Neurological:  Negative for dizziness and light-headedness.    PHYSICAL  EXAM:    02/24/2022   10:22 AM 01/29/2022    2:25 PM 01/27/2022    8:45 AM  Vitals with BMI  Height _0  _1    Weight 116 lbs 113 lbs 10 oz   BMI 54.09 81.19   Systolic 147 829 562  Diastolic 74 60 77  Pulse 67 84 90    Physical Exam  Constitutional: No distress.  Age appropriate, hemodynamically stable.   Neck: No JVD present.  Cardiovascular: Normal rate, regular rhythm, S1 normal, S2 normal and intact distal pulses. Exam reveals no gallop, no S3 and no S4.  No murmur heard. Pulses:      Radial pulses are 2+ on the right side and 2+ on the left side.       Dorsalis pedis pulses are 1+ on the right side and 1+ on the left side.       Posterior tibial pulses are 0 on the right side and 0 on the left side.  Pulmonary/Chest: Effort normal and breath sounds normal. No stridor. He has no wheezes. He has no rales.  Abdominal: Soft. Bowel sounds are normal. He exhibits no distension. There is no abdominal tenderness.  Musculoskeletal:        General: No edema.     Cervical back: Neck supple.  Neurological: He is alert and oriented to person, place, and time. He has intact cranial nerves (2-12).  Skin: Skin is warm and moist.   CARDIAC DATABASE: EKG: 01/27/2022: Normal sinus rhythm, 83 bpm, without underlying ischemia or injury pattern.  Echocardiogram: 01/30/2022: Normal LV systolic function with visual EF 60-65%. Left ventricle cavity is normal in size. Normal left ventricular wall thickness. Normal global wall motion. Normal diastolic filling pattern, normal LAP.  Mild tricuspid regurgitation. No evidence of pulmonary hypertension. No prior study for comparison.   Stress Testing: Exercise nuclear stress test 02/05/2022: Myocardial perfusion is normal. Overall LV systolic function is normal without regional wall motion abnormalities. Stress LV EF: 68%.  Normal ECG stress. Exercise nuclear stress test was performed using Bruce protocol. Patient reached 6 METS, and 86% of age  predicted maximum heart rate. Exercise capacity was low. The heart rate response was normal. The baseline blood pressure was 160/90 mmHg and increased to 170/98 mmHg, which is a normal response to exercise.  No previous exam available for comparison. Low risk study.  Heart Catheterization: None  ABI w/wo TBI:  12/26/2021 Right: Although resting right ankle-brachial index appears to be within normal range, may be falsely elevated. Monophasic waveforms noted, The right toe-brachial index is abnormal.   Left: Resting left ankle-brachial index indicates mild left lower  extremity arterial disease. The left toe-brachial index is abnormal.   CT Cardiac Scoring: 02/02/2022 at [facility] Left Main: 0. LVEDP 25. Circumflex: 16. RCA 98 PDA: 0 Total CAC 104 AU, 0-25th percentile for patient's age and gender based on [place] database. Noncardiac findings: Emphysematous changes.  LABORATORY DATA: External Labs: Collected: 12/17/2021 provided by referring physician. BNP 48. D-dimer 0.75. Total cholesterol 195, HDL 58, triglycerides 97, LDL 117, non-HDL 137. BUN 20, creatinine 0.96. Sodium 142, potassium 4.2, chloride 103, bicarb 30 AST  19, ALT 13, alkaline phosphatase 79. Hemoglobin 17.1 g/dL, hematocrit 50.3%. PSA total 6.58 (normal <4 ng/mL)   IMPRESSION:    ICD-10-CM   1. Coronary atherosclerosis due to calcified coronary lesion  I25.10 Lipid Panel With LDL/HDL Ratio   I25.84 LDL cholesterol, direct    CMP14+EGFR    2. Dyspnea on exertion  R06.09     3. Benign hypertension  I10 losartan (COZAAR) 50 MG tablet    4. Mixed hyperlipidemia  E78.2 Lipid Panel With LDL/HDL Ratio    LDL cholesterol, direct    CMP14+EGFR    5. Peripheral vascular disease (HCC)  I73.9     6. Former smoker  Z87.891        RECOMMENDATIONS: Lorena Clearman is a 76 y.o. [place] male whose past medical history and cardiac risk factors include: Moderate CAC (104AU), Former smoker, Raynauds's phenomenon  (per patient), HTN.  Coronary atherosclerosis due to calcified coronary lesion Total CAC 104 AU. Continue aspirin and statin therapy. Echo: Preserved LVEF. Exercise nuclear stress test: Low risk study. Reemphasized the importance of improving his modifiable cardiovascular risk factors.  Dyspnea on exertion Chronic and stable. Overall euvolemic and not in heart failure. Multifactorial: Given his prolonged history of smoking cannot rule out underlying COPD/emphysema, elevated blood pressures, other noncardiac etiologies. Has an appointment to see pulmonary medicine in November 2023. Ischemic work-up as outlined above. Was started on losartan at the last office visit due to elevated blood pressures.  Office blood pressures are within acceptable limits.  However, during his exercise nuclear stress test his blood pressures were not well controlled.  We will uptitrate his losartan to 50 mg p.o. daily with labs in 1 week.  Benign hypertension Improving. Increase losartan as discussed above. Labs in 1 week  Mixed hyperlipidemia Indexed LDL 117 mg/dL. Given his coronary artery calcification and PAD recommended statin therapy. Tolerating rosuvastatin well. We will order follow-up lipid profile to reevaluate lipids and LFTs.  Peripheral vascular disease (HCC) Abnormal ABIs in the past. Diminished peripheral pulses on physical examination. No evidence of critical limb ischemia. Currently on nifedipine per vascular surgery-per patient. Has an upcoming appointment with vascular surgery for his underlying peripheral vascular disease and Raynaud's. We will continue aspirin and statin therapy for now. Educated on importance of increasing physical activity as tolerated.  FINAL MEDICATION LIST END OF ENCOUNTER: Meds ordered this encounter  Medications   losartan (COZAAR) 50 MG tablet    Sig: Take 1 tablet (50 mg total) by mouth every morning.    Dispense:  30 tablet    Refill:  0     Medications Discontinued During This Encounter  Medication Reason   Fluticasone-Umeclidin-Vilant (TRELEGY ELLIPTA) 100-62.5-25 MCG/ACT AEPB Duplicate   losartan (COZAAR) 25 MG tablet Reorder     Current Outpatient Medications:    aspirin EC 81 MG tablet, Take 1 tablet (81 mg total) by mouth daily. Swallow whole., Disp: 30 tablet, Rfl: 12   Fluticasone-Umeclidin-Vilant (TRELEGY ELLIPTA) 100-62.5-25 MCG/ACT AEPB, Inhale 1 puff into the lungs daily., Disp: 60 each, Rfl: 3   ibuprofen (ADVIL,MOTRIN) 400 MG tablet, Take 1 tablet (400 mg total) by mouth every 8 (eight) hours as needed for moderate pain., Disp: , Rfl:    NIFEdipine (PROCARDIA-XL/NIFEDICAL-XL) 30 MG 24 hr tablet, Take 1 tablet (30 mg total) by mouth daily., Disp: 90 tablet, Rfl: 0   rosuvastatin (CRESTOR) 20 MG tablet, Take 1 tablet (20 mg total) by mouth at bedtime., Disp: 90 tablet, Rfl: 0   losartan (COZAAR) 50  MG tablet, Take 1 tablet (50 mg total) by mouth every morning., Disp: 30 tablet, Rfl: 0  Orders Placed This Encounter  Procedures   Lipid Panel With LDL/HDL Ratio   LDL cholesterol, direct   CMP14+EGFR    There are no Patient Instructions on file for this visit.   --Continue cardiac medications as reconciled in final medication list. --Return in about 6 months (around 08/25/2022) for Follow up, Coronary artery calcification, Dyspnea. or sooner if needed. --Continue follow-up with your primary care physician regarding the management of your other chronic comorbid conditions.  Patient's questions and concerns were addressed to his satisfaction. He voices understanding of the instructions provided during this encounter.   This note was created using a voice recognition software as a result there may be grammatical errors inadvertently enclosed that do not reflect the nature of this encounter. Every attempt is made to correct such errors.  Rex Kras, [place], [facility]  Pager: [phone] Office: [phone]

## 2022-03-02 ENCOUNTER — Telehealth: Payer: Self-pay

## 2022-03-02 NOTE — Telephone Encounter (Signed)
Pt called stating that he has an appt for 12/1, but the medication that Dr. Virl Cagey prescribed is not working and is requesting an earlier appt.  Reviewed pt's chart, returned call for clarification, two identifiers used. Pt described his pain and discomfort as someone holding his leg tightly. It's happening earlier with ambulation and moving from the posterior of his leg around to the anterior. Appts r/s. Confirmed understanding.

## 2022-03-03 DIAGNOSIS — I739 Peripheral vascular disease, unspecified: Secondary | ICD-10-CM | POA: Diagnosis not present

## 2022-03-03 DIAGNOSIS — Z23 Encounter for immunization: Secondary | ICD-10-CM | POA: Diagnosis not present

## 2022-03-03 DIAGNOSIS — M130 Polyarthritis, unspecified: Secondary | ICD-10-CM | POA: Diagnosis not present

## 2022-03-05 ENCOUNTER — Ambulatory Visit (INDEPENDENT_AMBULATORY_CARE_PROVIDER_SITE_OTHER): Payer: PPO | Admitting: Pulmonary Disease

## 2022-03-05 ENCOUNTER — Telehealth: Payer: Self-pay | Admitting: Pulmonary Disease

## 2022-03-05 DIAGNOSIS — R0609 Other forms of dyspnea: Secondary | ICD-10-CM

## 2022-03-05 LAB — PULMONARY FUNCTION TEST
DL/VA % pred: 71 %
DL/VA: 2.93 ml/min/mmHg/L
DLCO cor % pred: 75 %
DLCO cor: 15.07 ml/min/mmHg
DLCO unc % pred: 75 %
DLCO unc: 15.07 ml/min/mmHg
FEF 25-75 Post: 1.7 L/sec
FEF 25-75 Pre: 1.83 L/sec
FEF2575-%Change-Post: -7 %
FEF2575-%Pred-Post: 109 %
FEF2575-%Pred-Pre: 117 %
FEV1-%Change-Post: -1 %
FEV1-%Pred-Post: 110 %
FEV1-%Pred-Pre: 112 %
FEV1-Post: 2.41 L
FEV1-Pre: 2.44 L
FEV1FVC-%Change-Post: 2 %
FEV1FVC-%Pred-Pre: 101 %
FEV6-%Change-Post: -3 %
FEV6-%Pred-Post: 112 %
FEV6-%Pred-Pre: 116 %
FEV6-Post: 3.2 L
FEV6-Pre: 3.32 L
FEV6FVC-%Change-Post: 0 %
FEV6FVC-%Pred-Post: 108 %
FEV6FVC-%Pred-Pre: 108 %
FVC-%Change-Post: -3 %
FVC-%Pred-Post: 104 %
FVC-%Pred-Pre: 107 %
FVC-Post: 3.2 L
FVC-Pre: 3.32 L
Post FEV1/FVC ratio: 75 %
Post FEV6/FVC ratio: 100 %
Pre FEV1/FVC ratio: 74 %
Pre FEV6/FVC Ratio: 100 %
RV % pred: 84 %
RV: 1.85 L
TLC % pred: 94 %
TLC: 5.31 L

## 2022-03-05 NOTE — Progress Notes (Signed)
PFT done today. 

## 2022-03-05 NOTE — Telephone Encounter (Signed)
While patient was in office today I gave him 2 samples of Trelegy 100 as he requested. Nothing further needed

## 2022-03-09 ENCOUNTER — Other Ambulatory Visit: Payer: Self-pay | Admitting: Cardiology

## 2022-03-09 DIAGNOSIS — L820 Inflamed seborrheic keratosis: Secondary | ICD-10-CM | POA: Diagnosis not present

## 2022-03-09 DIAGNOSIS — L918 Other hypertrophic disorders of the skin: Secondary | ICD-10-CM | POA: Diagnosis not present

## 2022-03-09 DIAGNOSIS — K644 Residual hemorrhoidal skin tags: Secondary | ICD-10-CM | POA: Diagnosis not present

## 2022-03-10 LAB — BASIC METABOLIC PANEL
BUN/Creatinine Ratio: 22 (ref 10–24)
BUN: 22 mg/dL (ref 8–27)
CO2: 26 mmol/L (ref 20–29)
Calcium: 9.8 mg/dL (ref 8.6–10.2)
Chloride: 103 mmol/L (ref 96–106)
Creatinine, Ser: 0.98 mg/dL (ref 0.76–1.27)
Glucose: 113 mg/dL — ABNORMAL HIGH (ref 70–99)
Potassium: 5.2 mmol/L (ref 3.5–5.2)
Sodium: 141 mmol/L (ref 134–144)
eGFR: 80 mL/min/{1.73_m2} (ref >59)

## 2022-03-11 NOTE — Progress Notes (Unsigned)
Office Note    HPI: Mark Hansen is a 76 y.o. (1946-02-06) male presenting in follow-up with known peripheral arterial disease and Raynaud's phenomenon.  Quention was last seen in clinic 1 month ago and prescribed nifedipine for Raynaud's.  He called the office complaining of worsening bilateral thigh cramping which was appreciated with roughly 3 minutes of ambulation.  On exam, Mark Hansen was sitting comfortably.  He denied rest pain, tissue loss.  Over the last month, he stated the pain he was originally appreciating in his calves has now moved to his thighs.  He continues to ambulate, but has to stop a few times if ambulating through the grocery store.  Originally from [place], he immig[place] in the [place]s.  He is now retired.  He continues to smoke on a daily basis.   Past Medical History:  Diagnosis Date   Anxiety    Back pain    GERD (gastroesophageal reflux disease)    Hip fracture (HCC) 02/05/2017   left   Peripheral vascular disease (HCC)    Prostate disease    Thrombocytopenia (HCC) 02/05/2017    Past Surgical History:  Procedure Laterality Date   ANKLE SURGERY     bilateral ankle surgery   COLONOSCOPY  01/07/2011   FEMUR IM NAIL Left 02/05/2017   Procedure: INTRAMEDULLARY (IM) NAIL FEMORAL LEFT;  Surgeon: Yolonda Kida, MD;  Location: [facility];  Service: Orthopedics;  Laterality: Left;   OTHER SURGICAL HISTORY     unspecified type of prostate surgery    Social History   Socioeconomic History   Marital status: Widowed    Spouse name: Not on file   Number of children: 3   Years of education: Not on file   Highest education level: Not on file  Occupational History   Occupation: Disabled    Comment: secondary to accident at work several years ago.  Tobacco Use   Smoking status: Former    Packs/day: 1.00    Years: 35.00    Total pack years: 35.00    Types: Cigarettes    Quit date: 2010    Years since quitting: 13.8   Smokeless tobacco:  Never  Vaping Use   Vaping Use: Never used  Substance and Sexual Activity   Alcohol use: No    Comment: Former heavy EtOH use, denies use in past 10 years.   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    Admitted to [facility]   Widowed, one daughter, two sons   Former smoker   Alcohol none   Full Code.    Social Determinants of Health   Financial Resource Strain: Not on file  Food Insecurity: Not on file  Transportation Needs: Not on file  Physical Activity: Not on file  Stress: Not on file  Social Connections: Not on file  Intimate Partner Violence: Not on file    Family History  Problem Relation Age of Onset   CAD Mother 35   Asthma Mother    Asthma Father    Alzheimer's disease Father    Emphysema Father    CAD Brother 64       requiring bypass   Kidney disease Other        cousin    Current Outpatient Medications  Medication Sig  Dispense Refill   aspirin EC 81 MG tablet Take 1 tablet (81 mg total) by mouth daily. Swallow whole. 30 tablet 12   Fluticasone-Umeclidin-Vilant (TRELEGY ELLIPTA) 100-62.5-25 MCG/ACT AEPB Inhale 1 puff into the lungs daily. 60 each 3   ibuprofen (ADVIL,MOTRIN) 400 MG tablet Take 1 tablet (400 mg total) by mouth every 8 (eight) hours as needed for moderate pain.     losartan (COZAAR) 50 MG tablet Take 1 tablet (50 mg total) by mouth every morning. 30 tablet 0   NIFEdipine (PROCARDIA-XL/NIFEDICAL-XL) 30 MG 24 hr tablet Take 1 tablet (30 mg total) by mouth daily. 90 tablet 0   rosuvastatin (CRESTOR) 20 MG tablet Take 1 tablet (20 mg total) by mouth at bedtime. 90 tablet 0   No current facility-administered medications for this visit.    No Known Allergies   REVIEW OF SYSTEMS:   [X]  denotes positive finding, [ ]  denotes negative finding Cardiac  Comments:  Chest pain or chest  pressure:    Shortness of breath upon exertion:    Short of breath when lying flat:    Irregular heart rhythm:        Vascular    Pain in calf, thigh, or hip brought on by ambulation:    Pain in feet at night that wakes you up from your sleep:     Blood clot in your veins:    Leg swelling:         Pulmonary    Oxygen at home:    Productive cough:     Wheezing:         Neurologic    Sudden weakness in arms or legs:     Sudden numbness in arms or legs:     Sudden onset of difficulty speaking or slurred speech:    Temporary loss of vision in one eye:     Problems with dizziness:         Gastrointestinal    Blood in stool:     Vomited blood:         Genitourinary    Burning when urinating:     Blood in urine:        Psychiatric    Major depression:         Hematologic    Bleeding problems:    Problems with blood clotting too easily:        Skin    Rashes or ulcers:        Constitutional    Fever or chills:      PHYSICAL EXAMINATION:  There were no vitals filed for this visit.  General:  WDWN in NAD; vital signs documented above Gait: Not observed HENT: WNL, normocephalic Pulmonary: normal non-labored breathing , without wheezing Cardiac: regular HR Abdomen: soft, NT, no masses Skin: without rashes Vascular Exam/Pulses:  Right Left  Radial 2+ (normal) 2+ (normal)  Ulnar 2+ (normal) 2+ (normal)  Femoral 2+ 2+  Popliteal    DP 2+ (normal) 2+ (normal)  PT     Extremities: with ischemic changes, without Gangrene , without cellulitis; without open wounds;  Musculoskeletal: no muscle wasting or atrophy  Neurologic: A&O X 3;  No focal weakness or paresthesias are detected Psychiatric:  The pt has Normal affect.   Non-Invasive Vascular Imaging:   ABI Findings:  +---------+------------------+-----+----------+--------+  Right    Rt Pressure (mmHg)IndexWaveform  Comment   +---------+------------------+-----+----------+--------+  Brachial 164                                         +---------+------------------+-----+----------+--------+  PTA      144               0.88 monophasic          +---------+------------------+-----+----------+--------+  DP       157               0.96 monophasic          +---------+------------------+-----+----------+--------+  Great Toe35                0.21 Abnormal            +---------+------------------+-----+----------+--------+   +---------+------------------+-----+----------+-------+  Left     Lt Pressure (mmHg)IndexWaveform  Comment  +---------+------------------+-----+----------+-------+  Brachial 160                                       +---------+------------------+-----+----------+-------+  PTA      137               0.84 monophasic         +---------+------------------+-----+----------+-------+  DP       152               0.93 biphasic           +---------+------------------+-----+----------+-------+  Great Toe31                0.19 Abnormal           +---------+------------------+-----+----------+-------+     ASSESSMENT/PLAN: Bossie Giambra is a 76 y.o. male presenting with concern for progression in location symptoms over the last month.  The calf pain has now migrated into the thighs and is present after roughly 3 minutes of ambulation.  ABIs were reviewed demonstrating monophasic waveforms bilaterally, however significant pressure at the level of the ankle.  There is decreased toe pressure bilaterally.  Interestingly, Mark Hansen has palpable dorsalis pedal pulses bilaterally which is usually inconsistent with lifestyle limiting claudication. Also on physical exam, there was a 1+ femoral pulse, which makes me question as to whether there is inflow disease.  I had a long discussion with Mark Hansen regarding the above.  I think he would still be best suited with a daily walking routine, as well as smoking cessation.  I have also prescribed Pletal and a  final effort to stabilize his claudication symptoms.  Should he continue to have progression in his claudication, or move to rest pain, Mark Hansen and I discussed that he may need lower extremity angiography in an effort to define and improve distal perfusion.  At the time of his next visit, I plan to study possible inflow disease with an exercise ABI. I asked that he call my office should rest pain occur or should any wounds develop on the feet.   [name], MD [facility] [phone]

## 2022-03-13 ENCOUNTER — Encounter: Payer: Self-pay | Admitting: Vascular Surgery

## 2022-03-13 ENCOUNTER — Ambulatory Visit (HOSPITAL_COMMUNITY)
Admission: RE | Admit: 2022-03-13 | Discharge: 2022-03-13 | Disposition: A | Discharge status: Home / Self Care | Payer: PPO | Source: Ambulatory Visit | Attending: Vascular Surgery | Admitting: Vascular Surgery

## 2022-03-13 ENCOUNTER — Ambulatory Visit (INDEPENDENT_AMBULATORY_CARE_PROVIDER_SITE_OTHER): Payer: PPO | Admitting: Vascular Surgery

## 2022-03-13 VITALS — BP 134/80 | HR 76 | Temp 98.0°F | Resp 20 | Ht 63.0 in | Wt 113.0 lb

## 2022-03-13 DIAGNOSIS — I70213 Atherosclerosis of native arteries of extremities with intermittent claudication, bilateral legs: Secondary | ICD-10-CM | POA: Diagnosis not present

## 2022-03-13 DIAGNOSIS — I739 Peripheral vascular disease, unspecified: Secondary | ICD-10-CM | POA: Diagnosis not present

## 2022-03-13 MED ORDER — CILOSTAZOL 100 MG PO TABS: 100.0000 mg | ORAL_TABLET | Freq: Two times a day (BID) | ORAL | 11 refills | Status: DC

## 2022-03-16 ENCOUNTER — Other Ambulatory Visit: Payer: Self-pay

## 2022-03-16 DIAGNOSIS — I739 Peripheral vascular disease, unspecified: Secondary | ICD-10-CM

## 2022-03-17 NOTE — Progress Notes (Signed)
Called patient with results, he acknowledged understanding. He had no further questions.

## 2022-03-27 ENCOUNTER — Ambulatory Visit: Payer: Medicare (Managed Care) | Admitting: Vascular Surgery

## 2022-03-27 ENCOUNTER — Encounter (HOSPITAL_COMMUNITY): Payer: Medicare (Managed Care)

## 2022-03-30 ENCOUNTER — Telehealth: Payer: Self-pay

## 2022-03-30 NOTE — Telephone Encounter (Signed)
Pt called c/o problem with his medication.  Reviewed pt's chart, returned call for clarification, two identifiers used. Pt explained that he started the new medication and the side effects he experienced were improving, but the claudication symptoms were worse. Asked him about angiography, but he didn't recall anything about it from his last visit. Informed him that a staff msg would be sent to Dr. Karin Lieu to see what he advised. Confirmed understanding.

## 2022-04-08 DIAGNOSIS — E559 Vitamin D deficiency, unspecified: Secondary | ICD-10-CM | POA: Diagnosis not present

## 2022-04-08 DIAGNOSIS — K644 Residual hemorrhoidal skin tags: Secondary | ICD-10-CM | POA: Diagnosis not present

## 2022-04-08 DIAGNOSIS — Z0001 Encounter for general adult medical examination with abnormal findings: Secondary | ICD-10-CM | POA: Diagnosis not present

## 2022-04-08 DIAGNOSIS — I10 Essential (primary) hypertension: Secondary | ICD-10-CM | POA: Diagnosis not present

## 2022-04-08 DIAGNOSIS — R7303 Prediabetes: Secondary | ICD-10-CM | POA: Diagnosis not present

## 2022-04-08 DIAGNOSIS — Z0000 Encounter for general adult medical examination without abnormal findings: Secondary | ICD-10-CM | POA: Diagnosis not present

## 2022-04-08 DIAGNOSIS — I7300 Raynaud's syndrome without gangrene: Secondary | ICD-10-CM | POA: Diagnosis not present

## 2022-04-10 ENCOUNTER — Encounter: Payer: Self-pay | Admitting: Vascular Surgery

## 2022-04-10 ENCOUNTER — Ambulatory Visit (INDEPENDENT_AMBULATORY_CARE_PROVIDER_SITE_OTHER): Payer: PPO | Admitting: Vascular Surgery

## 2022-04-10 VITALS — BP 158/93 | HR 88 | Temp 98.1°F | Resp 20 | Ht 63.0 in | Wt 115.0 lb

## 2022-04-10 DIAGNOSIS — I739 Peripheral vascular disease, unspecified: Secondary | ICD-10-CM

## 2022-04-10 DIAGNOSIS — I70213 Atherosclerosis of native arteries of extremities with intermittent claudication, bilateral legs: Secondary | ICD-10-CM

## 2022-04-13 NOTE — Progress Notes (Signed)
Office Note    HPI: Mark Hansen is a 76 y.o. (24-Oct-1945) male presenting in follow-up with known peripheral arterial disease and Raynaud's phenomenon.  He called the office with continued pain in his glutes and thighs.  He states he has had some relief using Pletal.  On exam, Mark Hansen was sitting comfortably.  He denied rest pain, tissue loss.  Over the two weeks, he stated the cramping pains of continued in his thighs and glutes.  He continues to ambulate, but has to stop a few times if ambulating through the grocery store.  Originally from [place], he immig[place] in the [place]s.  He is now retired.  He continues to smoke on a daily basis.   Past Medical History:  Diagnosis Date   Anxiety    Back pain    GERD (gastroesophageal reflux disease)    Hip fracture (HCC) 02/05/2017   left   Peripheral vascular disease (HCC)    Prostate disease    Thrombocytopenia (HCC) 02/05/2017    Past Surgical History:  Procedure Laterality Date   ANKLE SURGERY     bilateral ankle surgery   COLONOSCOPY  01/07/2011   FEMUR IM NAIL Left 02/05/2017   Procedure: INTRAMEDULLARY (IM) NAIL FEMORAL LEFT;  Surgeon: Yolonda Kida, MD;  Location: [facility];  Service: Orthopedics;  Laterality: Left;   OTHER SURGICAL HISTORY     unspecified type of prostate surgery    Social History   Socioeconomic History   Marital status: Widowed    Spouse name: Not on file   Number of children: 3   Years of education: Not on file   Highest education level: Not on file  Occupational History   Occupation: Disabled    Comment: secondary to accident at work several years ago.  Tobacco Use   Smoking status: Former    Packs/day: 1.00    Years: 35.00    Total pack years: 35.00    Types: Cigarettes    Quit date: 2010    Years since quitting: 13.9   Smokeless tobacco: Never  Vaping Use   Vaping Use: Never used  Substance and Sexual Activity   Alcohol use: No    Comment: Former heavy EtOH use,  denies use in past 10 years.   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    Admitted to [facility]   Widowed, one daughter, two sons   Former smoker   Alcohol none   Full Code.    Social Determinants of Health   Financial Resource Strain: Not on file  Food Insecurity: Not on file  Transportation Needs: Not on file  Physical Activity: Not on file  Stress: Not on file  Social Connections: Not on file  Intimate Partner Violence: Not on file    Family History  Problem Relation Age of Onset   CAD Mother 74   Asthma Mother    Asthma Father    Alzheimer's disease Father    Emphysema Father    CAD Brother 58       requiring bypass   Kidney disease Other        cousin    Current Outpatient Medications  Medication Sig Dispense Refill   aspirin EC 81 MG tablet Take 1 tablet (81 mg total) by mouth  daily. Swallow whole. 30 tablet 12   cilostazol (PLETAL) 100 MG tablet Take 1 tablet (100 mg total) by mouth 2 (two) times daily before a meal. 60 tablet 11   Fluticasone-Umeclidin-Vilant (TRELEGY ELLIPTA) 100-62.5-25 MCG/ACT AEPB Inhale 1 puff into the lungs daily. 60 each 3   ibuprofen (ADVIL,MOTRIN) 400 MG tablet Take 1 tablet (400 mg total) by mouth every 8 (eight) hours as needed for moderate pain.     NIFEdipine (PROCARDIA-XL/NIFEDICAL-XL) 30 MG 24 hr tablet Take 1 tablet (30 mg total) by mouth daily. 90 tablet 0   rosuvastatin (CRESTOR) 20 MG tablet Take 1 tablet (20 mg total) by mouth at bedtime. 90 tablet 0   losartan (COZAAR) 50 MG tablet Take 1 tablet (50 mg total) by mouth every morning. 30 tablet 0   No current facility-administered medications for this visit.    No Known Allergies   REVIEW OF SYSTEMS:   [X]  denotes positive finding, [ ]  denotes negative finding Cardiac  Comments:  Chest pain or chest  pressure:    Shortness of breath upon exertion:    Short of breath when lying flat:    Irregular heart rhythm:        Vascular    Pain in calf, thigh, or hip brought on by ambulation:    Pain in feet at night that wakes you up from your sleep:     Blood clot in your veins:    Leg swelling:         Pulmonary    Oxygen at home:    Productive cough:     Wheezing:         Neurologic    Sudden weakness in arms or legs:     Sudden numbness in arms or legs:     Sudden onset of difficulty speaking or slurred speech:    Temporary loss of vision in one eye:     Problems with dizziness:         Gastrointestinal    Blood in stool:     Vomited blood:         Genitourinary    Burning when urinating:     Blood in urine:        Psychiatric    Major depression:         Hematologic    Bleeding problems:    Problems with blood clotting too easily:        Skin    Rashes or ulcers:        Constitutional    Fever or chills:      PHYSICAL EXAMINATION:  Vitals:   04/10/22 1216  BP: (!) 158/93  Pulse: 88  Resp: 20  Temp: 98.1 F (36.7 C)  SpO2: 97%  Weight: 115 lb (52.2 kg)  Height: 5\' 3"  (1.6 m)    General:  WDWN in NAD; vital signs documented above Gait: Not observed HENT: WNL, normocephalic Pulmonary: normal non-labored breathing , without wheezing Cardiac: regular HR Abdomen: soft, NT, no masses Skin: without rashes Vascular Exam/Pulses:  Right Left  Radial 2+ (normal) 2+ (normal)  Ulnar 2+ (normal) 2+ (normal)  Femoral 2+ 2+  Popliteal    DP 2+ (normal) 2+ (normal)  PT     Extremities: with ischemic changes, without Gangrene , without cellulitis; without open wounds;  Musculoskeletal: no muscle wasting or atrophy  Neurologic: A&O X 3;  No focal weakness or paresthesias are detected Psychiatric:  The pt has Normal affect.   Non-Invasive Vascular Imaging:  ABI Findings:  +---------+------------------+-----+----------+--------+  Right    Rt Pressure  (mmHg)IndexWaveform  Comment   +---------+------------------+-----+----------+--------+  Brachial 164                                        +---------+------------------+-----+----------+--------+  PTA      144               0.88 monophasic          +---------+------------------+-----+----------+--------+  DP       157               0.96 monophasic          +---------+------------------+-----+----------+--------+  Great Toe35                0.21 Abnormal            +---------+------------------+-----+----------+--------+   +---------+------------------+-----+----------+-------+  Left     Lt Pressure (mmHg)IndexWaveform  Comment  +---------+------------------+-----+----------+-------+  Brachial 160                                       +---------+------------------+-----+----------+-------+  PTA      137               0.84 monophasic         +---------+------------------+-----+----------+-------+  DP       152               0.93 biphasic           +---------+------------------+-----+----------+-------+  Great Toe31                0.19 Abnormal           +---------+------------------+-----+----------+-------+     ASSESSMENT/PLAN: Mark Hansen is a 76 y.o. male presenting with concern for progression in location symptoms over the last month.  The calf pain has now migrated into the thighs and is present after roughly 3 minutes of ambulation.  ABIs were reviewed demonstrating monophasic waveforms bilaterally, however significant pressure at the level of the ankle.  There is decreased toe pressure bilaterally.  Interestingly, Mark Hansen has palpable dorsalis pedal pulses bilaterally which is usually inconsistent with lifestyle limiting claudication. Also on physical exam, there is a 1+ femoral pulse, which makes me question as to whether there is any inflow disease.  I have moved up his exercise ABI in an effort to assess whether he could  have some inflow disease contributing to his thighs and gluteal cramping.  Should this be negative, I plan to refer him to spine surgery for spinal stenosis workup.   Mark Sparrow, MD [facility] [phone]

## 2022-04-14 ENCOUNTER — Other Ambulatory Visit: Payer: Self-pay | Admitting: *Deleted

## 2022-04-14 DIAGNOSIS — I739 Peripheral vascular disease, unspecified: Secondary | ICD-10-CM

## 2022-04-16 ENCOUNTER — Ambulatory Visit (HOSPITAL_COMMUNITY)
Admission: RE | Admit: 2022-04-16 | Discharge: 2022-04-16 | Disposition: A | Discharge status: Home / Self Care | Payer: PPO | Source: Ambulatory Visit | Attending: Vascular Surgery | Admitting: Vascular Surgery

## 2022-04-16 DIAGNOSIS — I739 Peripheral vascular disease, unspecified: Secondary | ICD-10-CM | POA: Insufficient documentation

## 2022-04-17 ENCOUNTER — Ambulatory Visit (INDEPENDENT_AMBULATORY_CARE_PROVIDER_SITE_OTHER): Payer: PPO | Admitting: Vascular Surgery

## 2022-04-17 DIAGNOSIS — I70213 Atherosclerosis of native arteries of extremities with intermittent claudication, bilateral legs: Secondary | ICD-10-CM

## 2022-04-17 NOTE — Progress Notes (Signed)
Office Note    HPI: Mark Hansen is a 76 y.o. ([birth year]) male presenting in follow-up with known peripheral arterial disease and Raynaud's phenomenon.  He has had continued pain in his glutes and thighs.  He states he has had some relief using Pletal.  No changes since last seen in clinic.  Originally from [place], he immig[place] in the [place]s.  He is now retired.  He continues to smoke on a daily basis.   Past Medical History:  Diagnosis Date   Anxiety    Back pain    GERD (gastroesophageal reflux disease)    Hip fracture (HCC) 02/05/2017   left   Peripheral vascular disease (HCC)    Prostate disease    Thrombocytopenia (HCC) 02/05/2017    Past Surgical History:  Procedure Laterality Date   ANKLE SURGERY     bilateral ankle surgery   COLONOSCOPY  01/07/2011   FEMUR IM NAIL Left 02/05/2017   Procedure: INTRAMEDULLARY (IM) NAIL FEMORAL LEFT;  Surgeon: Yolonda Kida, MD;  Location: [facility];  Service: Orthopedics;  Laterality: Left;   OTHER SURGICAL HISTORY     unspecified type of prostate surgery    Social History   Socioeconomic History   Marital status: Widowed    Spouse name: Not on file   Number of children: 3   Years of education: Not on file   Highest education level: Not on file  Occupational History   Occupation: Disabled    Comment: secondary to accident at work several years ago.  Tobacco Use   Smoking status: Former    Packs/day: 1.00    Years: 35.00    Total pack years: 35.00    Types: Cigarettes    Quit date: 2010    Years since quitting: 13.9   Smokeless tobacco: Never  Vaping Use   Vaping Use: Never used  Substance and Sexual Activity   Alcohol use: No    Comment: Former heavy EtOH use, denies use in past 10 years.   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    Admitted to  [facility]   Widowed, one daughter, two sons   Former smoker   Alcohol none   Full Code.    Social Determinants of Health   Financial Resource Strain: Not on file  Food Insecurity: Not on file  Transportation Needs: Not on file  Physical Activity: Not on file  Stress: Not on file  Social Connections: Not on file  Intimate Partner Violence: Not on file    Family History  Problem Relation Age of Onset   CAD Mother 58   Asthma Mother    Asthma Father    Alzheimer's disease Father    Emphysema Father    CAD Brother 78       requiring bypass   Kidney disease Other        cousin    Current Outpatient Medications  Medication Sig Dispense Refill   aspirin EC 81 MG tablet Take 1 tablet (81 mg total) by mouth daily. Swallow whole. 30 tablet 12   cilostazol (PLETAL) 100 MG tablet Take 1 tablet (100 mg total) by mouth 2 (two) times daily before a meal. 60 tablet 11   Fluticasone-Umeclidin-Vilant (TRELEGY ELLIPTA) 100-62.5-25 MCG/ACT AEPB Inhale 1 puff into  the lungs daily. 60 each 3   ibuprofen (ADVIL,MOTRIN) 400 MG tablet Take 1 tablet (400 mg total) by mouth every 8 (eight) hours as needed for moderate pain.     losartan (COZAAR) 50 MG tablet Take 1 tablet (50 mg total) by mouth every morning. 30 tablet 0   NIFEdipine (PROCARDIA-XL/NIFEDICAL-XL) 30 MG 24 hr tablet Take 1 tablet (30 mg total) by mouth daily. 90 tablet 0   rosuvastatin (CRESTOR) 20 MG tablet Take 1 tablet (20 mg total) by mouth at bedtime. 90 tablet 0   No current facility-administered medications for this visit.    No Known Allergies   REVIEW OF SYSTEMS:   [X]  denotes positive finding, [ ]  denotes negative finding Cardiac  Comments:  Chest pain or chest pressure:    Shortness of breath upon exertion:    Short of breath when lying flat:    Irregular heart rhythm:        Vascular    Pain in calf, thigh, or hip brought on by ambulation:    Pain in feet at night that wakes you up from your  sleep:     Blood clot in your veins:    Leg swelling:         Pulmonary    Oxygen at home:    Productive cough:     Wheezing:         Neurologic    Sudden weakness in arms or legs:     Sudden numbness in arms or legs:     Sudden onset of difficulty speaking or slurred speech:    Temporary loss of vision in one eye:     Problems with dizziness:         Gastrointestinal    Blood in stool:     Vomited blood:         Genitourinary    Burning when urinating:     Blood in urine:        Psychiatric    Major depression:         Hematologic    Bleeding problems:    Problems with blood clotting too easily:        Skin    Rashes or ulcers:        Constitutional    Fever or chills:      PHYSICAL EXAMINATION:  There were no vitals filed for this visit.   General:  WDWN in NAD; vital signs documented above Gait: Not observed HENT: WNL, normocephalic Pulmonary: normal non-labored breathing , without wheezing Cardiac: regular HR Abdomen: soft, NT, no masses Skin: without rashes Vascular Exam/Pulses:  Right Left  Radial 2+ (normal) 2+ (normal)  Ulnar 2+ (normal) 2+ (normal)  Femoral 2+ 2+  Popliteal    DP 2+ (normal) 2+ (normal)  PT     Extremities: with ischemic changes, without Gangrene , without cellulitis; without open wounds;  Musculoskeletal: no muscle wasting or atrophy  Neurologic: A&O X 3;  No focal weakness or paresthesias are detected Psychiatric:  The pt has Normal affect.   Non-Invasive Vascular Imaging:   ABI Findings:  +---------+------------------+-----+----------+--------+  Right    Rt Pressure (mmHg)IndexWaveform  Comment   +---------+------------------+-----+----------+--------+  Brachial 164                                        +---------+------------------+-----+----------+--------+  PTA      144  0.88 monophasic          +---------+------------------+-----+----------+--------+  DP       157                0.96 monophasic          +---------+------------------+-----+----------+--------+  Great Toe35                0.21 Abnormal            +---------+------------------+-----+----------+--------+   +---------+------------------+-----+----------+-------+  Left     Lt Pressure (mmHg)IndexWaveform  Comment  +---------+------------------+-----+----------+-------+  Brachial 160                                       +---------+------------------+-----+----------+-------+  PTA      137               0.84 monophasic         +---------+------------------+-----+----------+-------+  DP       152               0.93 biphasic           +---------+------------------+-----+----------+-------+  Great Toe31                0.19 Abnormal           +---------+------------------+-----+----------+-------+     ASSESSMENT/PLAN: Fahd Galea is a 76 y.o. male  whom I called for a phone visit who initially presented with concern for progression in location symptoms over the last month.  The calf pain has migrated into the thighs and is present after roughly 3 minutes of ambulation.  ABIs were reviewed demonstrating monophasic waveforms bilaterally, however significant pressure at the level of the ankle. Exercise ABIs demonstrated significant depression with ambulation - a sign of inflow disease.   My plan is to see him in 2 weeks in the office after undergoing CT scan to discuss treatment options.    Victorino Sparrow, MD [facility] [phone]

## 2022-04-22 ENCOUNTER — Other Ambulatory Visit: Payer: Self-pay

## 2022-04-22 DIAGNOSIS — I739 Peripheral vascular disease, unspecified: Secondary | ICD-10-CM

## 2022-04-22 DIAGNOSIS — I70213 Atherosclerosis of native arteries of extremities with intermittent claudication, bilateral legs: Secondary | ICD-10-CM

## 2022-05-07 ENCOUNTER — Ambulatory Visit (HOSPITAL_COMMUNITY)
Admission: RE | Admit: 2022-05-07 | Discharge: 2022-05-07 | Disposition: A | Discharge status: Home / Self Care | Payer: Medicare HMO | Source: Ambulatory Visit | Attending: Vascular Surgery | Admitting: Vascular Surgery

## 2022-05-07 DIAGNOSIS — I700 Atherosclerosis of aorta: Secondary | ICD-10-CM | POA: Diagnosis not present

## 2022-05-07 DIAGNOSIS — J432 Centrilobular emphysema: Secondary | ICD-10-CM | POA: Diagnosis not present

## 2022-05-07 DIAGNOSIS — I70213 Atherosclerosis of native arteries of extremities with intermittent claudication, bilateral legs: Secondary | ICD-10-CM | POA: Diagnosis not present

## 2022-05-07 DIAGNOSIS — I739 Peripheral vascular disease, unspecified: Secondary | ICD-10-CM | POA: Insufficient documentation

## 2022-05-07 DIAGNOSIS — K769 Liver disease, unspecified: Secondary | ICD-10-CM | POA: Diagnosis not present

## 2022-05-07 MED ORDER — IOHEXOL 350 MG/ML SOLN
100.0000 mL | Freq: Once | INTRAVENOUS | Status: AC | PRN
  Administered 2022-05-07: 100 mL via INTRAVENOUS

## 2022-05-15 ENCOUNTER — Ambulatory Visit: Payer: Medicare HMO | Admitting: Vascular Surgery

## 2022-05-15 ENCOUNTER — Encounter: Payer: Self-pay | Admitting: Vascular Surgery

## 2022-05-15 VITALS — BP 179/93 | HR 72 | Temp 98.2°F | Resp 20 | Ht 63.0 in | Wt 115.0 lb

## 2022-05-15 DIAGNOSIS — I70213 Atherosclerosis of native arteries of extremities with intermittent claudication, bilateral legs: Secondary | ICD-10-CM

## 2022-05-15 NOTE — Progress Notes (Signed)
Office Note    HPI: Markie Frith is a 77 y.o. ([birth year]) male presenting in follow-up with known peripheral arterial disease and Raynaud's phenomenon.  He has had continued pain in his glutes and thighs.  He states he has had some relief using Pletal.  On meds bilateral lower extremity, lifestyle-limiting claudication continues to worsen.  At the time of our last phone visit, we discussed CT angio abdomen pelvis in an effort to define possible inflow disease as his exercise ABI had abnormal results.  On exam today, he was doing well.  He continues to complain of lifestyle limiting claudication in bilateral lower extremities.  Rest pain.  No wounds.  Originally from [place], he immig[place] in the [place]s.  He is now retired.  He continues to smoke on a daily basis.   Past Medical History:  Diagnosis Date   Anxiety    Back pain    GERD (gastroesophageal reflux disease)    Hip fracture ([place]) 02/05/2017   left   Peripheral vascular disease ([place])    Prostate disease    Thrombocytopenia ([place]) 02/05/2017    Past Surgical History:  Procedure Laterality Date   ANKLE SURGERY     bilateral ankle surgery   COLONOSCOPY  01/07/2011   FEMUR IM NAIL Left 02/05/2017   Procedure: INTRAMEDULLARY (IM) NAIL FEMORAL LEFT;  Surgeon: Nicholes Stairs, MD;  Location: [facility];  Service: Orthopedics;  Laterality: Left;   OTHER SURGICAL HISTORY     unspecified type of prostate surgery    Social History   Socioeconomic History   Marital status: Widowed    Spouse name: Not on file   Number of children: 3   Years of education: Not on file   Highest education level: Not on file  Occupational History   Occupation: Disabled    Comment: secondary to accident at work several years ago.  Tobacco Use   Smoking status: Former    Packs/day: 1.00    Years: 35.00    Total pack years: 35.00    Types: Cigarettes    Quit date: 2010    Years since quitting: 14.0   Smokeless tobacco:  Never  Vaping Use   Vaping Use: Never used  Substance and Sexual Activity   Alcohol use: No    Comment: Former heavy EtOH use, denies use in past 10 years.   Drug use: No   Sexual activity: Not on file  Other Topics Concern   Not on file  Social History Narrative    Formerly worked in Academic librarian. Emigrated from [place] when he was 77 years old.    Admitted to [facility] and [place], one daughter, two sons   Former smoker   Alcohol none   Full Code.    Social Determinants of Health   Financial Resource Strain: Not on file  Food Insecurity: Not on file  Transportation Needs: Not on file  Physical Activity: Not on file  Stress: Not on file  Social Connections: Not on file  Intimate Partner Violence: Not on file    Family History  Problem Relation Age of Onset   CAD Mother 25   Asthma Mother    Asthma Father    Alzheimer's disease Father    Emphysema Father    CAD Brother 75       requiring bypass   Kidney disease Other        cousin    Current Outpatient Medications  Medication Sig  Dispense Refill   aspirin EC 81 MG tablet Take 1 tablet (81 mg total) by mouth daily. Swallow whole. 30 tablet 12   cilostazol (PLETAL) 100 MG tablet Take 1 tablet (100 mg total) by mouth 2 (two) times daily before a meal. 60 tablet 11   Fluticasone-Umeclidin-Vilant (TRELEGY ELLIPTA) 100-62.5-25 MCG/ACT AEPB Inhale 1 puff into the lungs daily. 60 each 3   ibuprofen (ADVIL,MOTRIN) 400 MG tablet Take 1 tablet (400 mg total) by mouth every 8 (eight) hours as needed for moderate pain.     losartan (COZAAR) 50 MG tablet Take 1 tablet (50 mg total) by mouth every morning. 30 tablet 0   NIFEdipine (PROCARDIA-XL/NIFEDICAL-XL) 30 MG 24 hr tablet Take 1 tablet (30 mg total) by mouth daily. 90 tablet 0   rosuvastatin (CRESTOR) 20 MG tablet Take 1 tablet (20 mg total) by mouth at bedtime. 90 tablet 0   No current facility-administered medications for this visit.    No Known  Allergies   REVIEW OF SYSTEMS:   [X]  denotes positive finding, [ ]  denotes negative finding Cardiac  Comments:  Chest pain or chest pressure:    Shortness of breath upon exertion:    Short of breath when lying flat:    Irregular heart rhythm:        Vascular    Pain in calf, thigh, or hip brought on by ambulation:    Pain in feet at night that wakes you up from your sleep:     Blood clot in your veins:    Leg swelling:         Pulmonary    Oxygen at home:    Productive cough:     Wheezing:         Neurologic    Sudden weakness in arms or legs:     Sudden numbness in arms or legs:     Sudden onset of difficulty speaking or slurred speech:    Temporary loss of vision in one eye:     Problems with dizziness:         Gastrointestinal    Blood in stool:     Vomited blood:         Genitourinary    Burning when urinating:     Blood in urine:        Psychiatric    Major depression:         Hematologic    Bleeding problems:    Problems with blood clotting too easily:        Skin    Rashes or ulcers:        Constitutional    Fever or chills:      PHYSICAL EXAMINATION:  There were no vitals filed for this visit.   General:  WDWN in NAD; vital signs documented above Gait: Not observed HENT: WNL, normocephalic Pulmonary: normal non-labored breathing , without wheezing Cardiac: regular HR Abdomen: soft, NT, no masses Skin: without rashes Vascular Exam/Pulses:  Right Left  Radial 2+ (normal) 2+ (normal)  Ulnar 2+ (normal) 2+ (normal)  Femoral 2+ 2+  Popliteal    DP 2+ (normal) 2+ (normal)  PT     Extremities: with ischemic changes, without Gangrene , without cellulitis; without open wounds;  Musculoskeletal: no muscle wasting or atrophy  Neurologic: A&O X 3;  No focal weakness or paresthesias are detected Psychiatric:  The pt has Normal affect.   Non-Invasive Vascular Imaging:   ABI Findings:  +---------+------------------+-----+----------+--------+   Right  Rt Pressure (mmHg)IndexWaveform  Comment   +---------+------------------+-----+----------+--------+  Brachial 164                                        +---------+------------------+-----+----------+--------+  PTA      144               0.88 monophasic          +---------+------------------+-----+----------+--------+  DP       157               0.96 monophasic          +---------+------------------+-----+----------+--------+  Great Toe35                0.21 Abnormal            +---------+------------------+-----+----------+--------+   +---------+------------------+-----+----------+-------+  Left     Lt Pressure (mmHg)IndexWaveform  Comment  +---------+------------------+-----+----------+-------+  Brachial 160                                       +---------+------------------+-----+----------+-------+  PTA      137               0.84 monophasic         +---------+------------------+-----+----------+-------+  DP       152               0.93 biphasic           +---------+------------------+-----+----------+-------+  Great Toe31                0.19 Abnormal           +---------+------------------+-----+----------+-------+     ASSESSMENT/PLAN: Ronald Londo is a 77 y.o. male  who initially presented with concern for progression in bilateral lower extremity claudication symptoms over the last month.  The lifestyle limiting calf pain has migrated into the thighs and is present after roughly 3 minutes of ambulation.  ABIs were reviewed demonstrating monophasic waveforms bilaterally, however he has significantly decreased pressure at the level of the ankle. Exercise ABIs demonstrated significant depression with ambulation - a sign of inflow disease.  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  Runoff appears normal.  [name] would benefit from endovascular intervention, covered stenting of the lesion in an effort to improve flow distally to alleviate his style-limiting claudication.  Zailyn I had a detailed discussion regarding endovascular aortic repair using a cuff to improve flow distally.  After discussing the risk and benefits, Higinio elected to proceed.   Patient has an appointment with pulmonologist on 05/27/2022.  I looked at his previous stress echo which demonstrated low risk, but being that this is elective surgery, I asked for cardiac clearance from Dr. Terri Skains.    [name], MD [facility] [phone]

## 2022-05-19 ENCOUNTER — Other Ambulatory Visit: Payer: Self-pay

## 2022-05-19 DIAGNOSIS — I70213 Atherosclerosis of native arteries of extremities with intermittent claudication, bilateral legs: Secondary | ICD-10-CM

## 2022-05-21 NOTE — Progress Notes (Signed)
Surgical Instructions    Your procedure is scheduled on Monday February 5th, 2024.  Report to [facility] Main Entrance "A" at 5:30 A.M., then check in with the Admitting office.  Call this number if you have problems the morning of surgery:  [phone]   If you have any questions prior to your surgery date call [phone]: Open Monday-Friday 8am-4pm If you experience any cold or flu symptoms such as cough, fever, chills, shortness of breath, etc. between now and your scheduled surgery, please notify us at the above number     Remember:  Do not eat or drink after midnight the night before your surgery   Take these medicines the morning of surgery with A SIP OF WATER:  None  If needed:  Fluticasone-Umeclidin-Vilant (TRELEGY ELLIPTA). Please bring with you the day of surgery.  Follow your surgeon's instructions on when to stop Aspirin.  If no instructions were given by your surgeon then you will need to call the office to get those instructions.    As of today, STOP taking (unless otherwise instructed by your surgeon) Aleve, Naproxen, Ibuprofen, Motrin, Advil, Goody's, BC's, all herbal medications, fish oil, and all vitamins.   SURGICAL WAITING ROOM VISITATION Patients having surgery or a procedure may have no more than 2 support people in the waiting area - these visitors may rotate.   Children under the age of 1 must have an adult with them who is not the patient. If the patient needs to stay at the hospital during part of their recovery, the visitor guidelines for inpatient rooms apply. Pre-op nurse will coordinate an appropriate time for 1 support person to accompany patient in pre-op.  This support person may not rotate.   Please refer to [email or web address] for the visitor guidelines for Inpatients (after your surgery is over and you are in a regular room).    Special instructions:    Oral Hygiene is also important to  reduce your risk of infection.  Remember - BRUSH YOUR TEETH THE MORNING OF SURGERY WITH YOUR REGULAR TOOTHPASTE   [place]- Preparing For Surgery  Before surgery, you can play an important role. Because skin is not sterile, your skin needs to be as free of germs as possible. You can reduce the number of germs on your skin by washing with CHG (chlorahexidine gluconate) Soap before surgery.  CHG is an antiseptic cleaner which kills germs and bonds with the skin to continue killing germs even after washing.     Please do not use if you have an allergy to CHG or antibacterial soaps. If your skin becomes reddened/irritated stop using the CHG.  Do not shave (including legs and underarms) for at least 48 hours prior to first CHG shower. It is OK to shave your face.  Please follow these instructions carefully.     Shower the NIGHT BEFORE SURGERY and the MORNING OF SURGERY with CHG Soap.   If you chose to wash your hair, wash your hair first as usual with your normal shampoo. After you shampoo, rinse your hair and body thoroughly to remove the shampoo.  Then [employer] and genitals (private parts) with your normal soap and rinse thoroughly to remove soap.  After that Use CHG Soap as you would any other liquid soap. You can apply CHG directly to the skin and wash gently with a scrungie or a clean washcloth.   Apply the CHG Soap to your body ONLY FROM THE NECK DOWN.  Do not use on open  wounds or open sores. Avoid contact with your eyes, ears, mouth and genitals (private parts). Wash Face and genitals (private parts)  with your normal soap.   Wash thoroughly, paying special attention to the area where your surgery will be performed.  Thoroughly rinse your body with warm water from the neck down.  DO NOT shower/wash with your normal soap after using and rinsing off the CHG Soap.  Pat yourself dry with a CLEAN TOWEL.  Wear CLEAN PAJAMAS to bed the night before surgery  Place CLEAN SHEETS on your  bed the night before your surgery  DO NOT SLEEP WITH PETS.   Day of Surgery:  Take a shower with CHG soap. Wear Clean/Comfortable clothing the morning of surgery Do not apply any deodorants/lotions.   Remember to brush your teeth WITH YOUR REGULAR TOOTHPASTE. Do not wear jewelry or makeup. Do not wear lotions, powders, perfumes/cologne or deodorant. Do not shave 48 hours prior to surgery.  Men may shave face and neck. Do not bring valuables to the hospital. Do not wear nail polish, gel polish, artificial nails, or any other type of covering on natural nails (fingers and toes) If you have artificial nails or gel coating that need to be removed by a nail salon, please have this removed prior to surgery. Artificial nails or gel coating may interfere with anesthesia's ability to adequately monitor your vital signs.  [place] is not responsible for any belongings or valuables.    Do NOT Smoke (Tobacco/Vaping)  24 hours prior to your procedure  If you use a CPAP at night, you may bring your mask for your overnight stay.   Contacts, glasses, hearing aids, dentures or partials may not be worn into surgery, please bring cases for these belongings   For patients admitted to the hospital, discharge time will be determined by your treatment team.   Patients discharged the day of surgery will not be allowed to drive home, and someone needs to stay with them for 24 hours.  If you received a COVID test during your pre-op visit, it is requested that you wear a mask when out in public, stay away from anyone that may not be feeling well, and notify your surgeon if you develop symptoms. If you have been in contact with anyone that has tested positive in the last 10 days, please notify your surgeon.    Please read over the following fact sheets that you were given.

## 2022-05-22 ENCOUNTER — Other Ambulatory Visit: Payer: Self-pay

## 2022-05-22 ENCOUNTER — Encounter (HOSPITAL_COMMUNITY): Payer: Self-pay

## 2022-05-22 ENCOUNTER — Encounter (HOSPITAL_COMMUNITY)
Admission: RE | Admit: 2022-05-22 | Discharge: 2022-05-22 | Disposition: A | Discharge status: Home / Self Care | Payer: Medicare HMO | Source: Ambulatory Visit | Attending: Vascular Surgery | Admitting: Vascular Surgery

## 2022-05-22 VITALS — BP 148/76 | HR 78 | Temp 98.3°F | Resp 18 | Ht 63.0 in | Wt 115.1 lb

## 2022-05-22 DIAGNOSIS — Z01818 Encounter for other preprocedural examination: Secondary | ICD-10-CM

## 2022-05-22 DIAGNOSIS — I70213 Atherosclerosis of native arteries of extremities with intermittent claudication, bilateral legs: Secondary | ICD-10-CM

## 2022-05-22 LAB — SURGICAL PCR SCREEN
MRSA, PCR: NEGATIVE
Staphylococcus aureus: NEGATIVE

## 2022-05-22 LAB — COMPREHENSIVE METABOLIC PANEL
ALT: 20 U/L (ref 0–44)
AST: 29 U/L (ref 15–41)
Albumin: 4.4 g/dL (ref 3.5–5.0)
Alkaline Phosphatase: 66 U/L (ref 38–126)
Anion gap: 8 (ref 5–15)
BUN: 16 mg/dL (ref 8–23)
CO2: 29 mmol/L (ref 22–32)
Calcium: 9.5 mg/dL (ref 8.9–10.3)
Chloride: 103 mmol/L (ref 98–111)
Creatinine, Ser: 1.03 mg/dL (ref 0.61–1.24)
GFR, Estimated: >60 mL/min (ref >60)
Glucose, Bld: 94 mg/dL (ref 70–99)
Potassium: 3.9 mmol/L (ref 3.5–5.1)
Sodium: 140 mmol/L (ref 135–145)
Total Bilirubin: 2.2 mg/dL — ABNORMAL HIGH (ref 0.3–1.2)
Total Protein: 7.1 g/dL (ref 6.5–8.1)

## 2022-05-22 LAB — URINALYSIS, ROUTINE W REFLEX MICROSCOPIC
Bacteria, UA: NONE SEEN
Bilirubin Urine: NEGATIVE
Glucose, UA: NEGATIVE mg/dL
Hgb urine dipstick: NEGATIVE
Ketones, ur: NEGATIVE mg/dL
Leukocytes,Ua: NEGATIVE
Nitrite: NEGATIVE
Protein, ur: NEGATIVE mg/dL
Specific Gravity, Urine: 1.023 (ref 1.005–1.030)
pH: 5 (ref 5.0–8.0)

## 2022-05-22 LAB — CBC
HCT: 52.2 % — ABNORMAL HIGH (ref 39.0–52.0)
Hemoglobin: 17.4 g/dL — ABNORMAL HIGH (ref 13.0–17.0)
MCH: 30 pg (ref 26.0–34.0)
MCHC: 33.3 g/dL (ref 30.0–36.0)
MCV: 90 fL (ref 80.0–100.0)
Platelets: 149 10*3/uL — ABNORMAL LOW (ref 150–400)
RBC: 5.8 MIL/uL (ref 4.22–5.81)
RDW: 12.5 % (ref 11.5–15.5)
WBC: 5.9 10*3/uL (ref 4.0–10.5)
nRBC: 0 % (ref 0.0–0.2)

## 2022-05-22 LAB — PROTIME-INR
INR: 1 (ref 0.8–1.2)
Prothrombin Time: 13.3 seconds (ref 11.4–15.2)

## 2022-05-22 LAB — APTT: aPTT: 37 seconds — ABNORMAL HIGH (ref 24–36)

## 2022-05-22 NOTE — Progress Notes (Signed)
Surgical Instructions    Your procedure is scheduled on Monday, 06/01/22.  Report to [facility] Main Entrance "A" at 5:30 A.M., then check in with the Admitting office.  Call this number if you have problems the morning of surgery:  [phone]   If you have any questions prior to your surgery date call [phone]: Open Monday-Friday 8am-4pm If you experience any cold or flu symptoms such as cough, fever, chills, shortness of breath, etc. between now and your scheduled surgery, please notify us at the above number     Remember:  Do not eat or drink after midnight the night before your surgery     Take these medicines the morning of surgery with A SIP OF WATER:  Aspirin cilostazol (PLETAL)?  As of today, STOP taking any (unless otherwise instructed by your surgeon) Aleve, Naproxen, Ibuprofen, Motrin, Advil, Goody's, BC's, all herbal medications, fish oil, and all vitamins.           Do not wear jewelry or makeup. Do not wear lotions, powders, cologne or deodorant. Men may shave face and neck. Do not bring valuables to the hospital. Do not wear nail polish, gel polish, artificial nails, or any other type of covering on natural nails (fingers and toes) If you have artificial nails or gel coating that need to be removed by a nail salon, please have this removed prior to surgery. Artificial nails or gel coating may interfere with anesthesia's ability to adequately monitor your vital signs.  [place] is not responsible for any belongings or valuables.    Do NOT Smoke (Tobacco/Vaping)  24 hours prior to your procedure  If you use a CPAP at night, you may bring your mask for your overnight stay.   Contacts, glasses, hearing aids, dentures or partials may not be worn into surgery, please bring cases for these belongings   For patients admitted to the hospital, discharge time will be determined by your treatment team.   Patients discharged the day of surgery will not be allowed to  drive home, and someone needs to stay with them for 24 hours.   SURGICAL WAITING ROOM VISITATION Patients having surgery or a procedure may have no more than 2 support people in the waiting area - these visitors may rotate.   Children under the age of 59 must have an adult with them who is not the patient. If the patient needs to stay at the hospital during part of their recovery, the visitor guidelines for inpatient rooms apply. Pre-op nurse will coordinate an appropriate time for 1 support person to accompany patient in pre-op.  This support person may not rotate.   Please refer to [email or web address] for the visitor guidelines for Inpatients (after your surgery is over and you are in a regular room).    Special instructions:    Oral Hygiene is also important to reduce your risk of infection.  Remember - BRUSH YOUR TEETH THE MORNING OF SURGERY WITH YOUR REGULAR TOOTHPASTE   [place]- Preparing For Surgery  Before surgery, you can play an important role. Because skin is not sterile, your skin needs to be as free of germs as possible. You can reduce the number of germs on your skin by washing with CHG (chlorahexidine gluconate) Soap before surgery.  CHG is an antiseptic cleaner which kills germs and bonds with the skin to continue killing germs even after washing.     Please do not use if you have an allergy to CHG or antibacterial soaps.  If your skin becomes reddened/irritated stop using the CHG.  Do not shave (including legs and underarms) for at least 48 hours prior to first CHG shower. It is OK to shave your face.  Please follow these instructions carefully.     Shower the NIGHT BEFORE SURGERY and the MORNING OF SURGERY with CHG Soap.   If you chose to wash your hair, wash your hair first as usual with your normal shampoo. After you shampoo, rinse your hair and body thoroughly to remove the shampoo.  Then [employer] and genitals  (private parts) with your normal soap and rinse thoroughly to remove soap.  After that Use CHG Soap as you would any other liquid soap. You can apply CHG directly to the skin and wash gently with a scrungie or a clean washcloth.   Apply the CHG Soap to your body ONLY FROM THE NECK DOWN.  Do not use on open wounds or open sores. Avoid contact with your eyes, ears, mouth and genitals (private parts). Wash Face and genitals (private parts)  with your normal soap.   Wash thoroughly, paying special attention to the area where your surgery will be performed.  Thoroughly rinse your body with warm water from the neck down.  DO NOT shower/wash with your normal soap after using and rinsing off the CHG Soap.  Pat yourself dry with a CLEAN TOWEL.  Wear CLEAN PAJAMAS to bed the night before surgery  Place CLEAN SHEETS on your bed the night before your surgery  DO NOT SLEEP WITH PETS.   Day of Surgery: Take a shower with CHG soap. Wear Clean/Comfortable clothing the morning of surgery Do not apply any deodorants/lotions.   Remember to brush your teeth WITH YOUR REGULAR TOOTHPASTE.    If you received a COVID test during your pre-op visit, it is requested that you wear a mask when out in public, stay away from anyone that may not be feeling well, and notify your surgeon if you develop symptoms. If you have been in contact with anyone that has tested positive in the last 10 days, please notify your surgeon.    Please read over the following fact sheets that you were given.

## 2022-05-22 NOTE — Progress Notes (Signed)
PCP - Lucianne Lei Cardiologist - Sunit Terri Skains Pulmonologist: Larey Days  PPM/ICD - denies   Chest x-ray - n/a EKG - 01/27/22 Stress Test - 02/05/22 ECHO - 01/30/22 Cardiac Cath - denies  Sleep Study - denies   Continue aspirin and pletal per surgeons instructions. Patient has stopped taking pletal on his own.   ERAS Protcol -no   COVID TEST- not needed   Anesthesia review: cardiac clearance requested from Dr. Terri Skains by Dr. Virl Cagey on 05/15/22. Pt has stopped taking pletal on his own but still takes aspirin 81mg  daily.   Patient denies shortness of breath, fever, cough and chest pain at PAT appointment   All instructions explained to the patient, with a verbal understanding of the material. Patient agrees to go over the instructions while at home for a better understanding. Patient also instructed to self quarantine after being tested for COVID-19. The opportunity to ask questions was provided.

## 2022-05-25 ENCOUNTER — Encounter: Payer: Self-pay | Admitting: Cardiology

## 2022-05-25 NOTE — Anesthesia Preprocedure Evaluation (Addendum)
Anesthesia Evaluation  Patient identified by MRN, date of birth, ID band Patient awake    Reviewed: Allergy & Precautions, NPO status , Patient's Chart, lab work & pertinent test results  Airway Mallampati: II  TM Distance: >3 FB Neck ROM: Full    Dental  (+) Dental Advisory Given, Partial Upper, Partial Lower, Chipped,    Pulmonary former smoker   Pulmonary exam normal breath sounds clear to auscultation       Cardiovascular hypertension, Pt. on medications (-) angina + Peripheral Vascular Disease (Atherosclerosis of native artery of both lower extremities with intermittent claudication)  Normal cardiovascular exam Rhythm:Regular Rate:Normal     Neuro/Psych  PSYCHIATRIC DISORDERS Anxiety     negative neurological ROS     GI/Hepatic Neg liver ROS,GERD  ,,  Endo/Other  negative endocrine ROS    Renal/GU negative Renal ROS     Musculoskeletal negative musculoskeletal ROS (+)    Abdominal   Peds  Hematology  (+) Blood dyscrasia (Plt 149k)   Anesthesia Other Findings   Reproductive/Obstetrics                             Anesthesia Physical Anesthesia Plan  ASA: 3  Anesthesia Plan: General   Post-op Pain Management: Tylenol PO (pre-op)*   Induction: Intravenous  PONV Risk Score and Plan: 2 and Dexamethasone and Ondansetron  Airway Management Planned: Oral ETT  Additional Equipment: Arterial line  Intra-op Plan:   Post-operative Plan: Extubation in OR  Informed Consent: I have reviewed the patients History and Physical, chart, labs and discussed the procedure including the risks, benefits and alternatives for the proposed anesthesia with the patient or authorized representative who has indicated his/her understanding and acceptance.     Dental advisory given  Plan Discussed with: CRNA  Anesthesia Plan Comments: (Arterial line, 2 large bore PIV  PAT note by Karoline Caldwell,  PA-C: Patient recently evaluated by cardiologist Dr. Terri Skains at [facility] cardiovascular for reported chest pain/heaviness. Echo noted preserved LVEF, moderate CAC per calcium score, and exercise nuclear stress test was overall low risk study.  Patient was cleared for surgery in letter dated 05/25/2022 stating, "Mark Hansen at acceptable risk, from a cardiac standpoint, for hisupcoming procedure: Abdominal aortic endovascular stent graft. If applicable can hold aspirin and cilostazol for 7 day(s) prior to procedure and re-start as per your discretion when appropriate hemostasis is achieved."  Follows with pulmonology for history of DOE and possible COPD.  He is a former smoker, 35 pack years, quit 2010.  Maintained on Trelegy Ellipta.  Notably, PFTs 03/05/2022 were within normal limits.  Preop labs reviewed, unremarkable.  EKG 01/27/2022: Normal sinus rhythm, 83 bpm, without underlying ischemia or injury pattern.  CTA aortobifem 05/07/2022: IMPRESSION: VASCULAR  1. 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 2. Otherwise, scattered atherosclerotic plaque without additional significant stenosis or occlusion.  NON-VASCULAR  1. Moderately advanced centrilobular pulmonary emphysema. 2. Additional ancillary findings as above.  Echocardiogram 01/30/2022: Normal LV systolic function with visual EF 60-65%. Left ventricle cavity is normal in size. Normal left ventricular wall thickness. Normal global wall motion. Normal diastolic filling pattern, normal LAP.  Mild tricuspid regurgitation. No evidence of pulmonary hypertension. No prior study for comparison.  Stress Testing 02/05/2022: Myocardial perfusion is normal. Overall LV systolic function is normal without regional wall motion abnormalities. Stress LV EF:  68%.  Normal ECG stress. Exercise  nuclear stress test was performed using Bruce protocol. Patient reached 6 METS, and 86% of age predicted maximum heart rate. Exercise capacity was low. The heart rate response was normal. The baseline blood pressure was 160/90 mmHg and increased to 170/98 mmHg, which is a normal response to exercise.  No previous exam available for comparison. Low risk study.  CT Cardiac Scoring 02/02/2022 at [facility]: Left Main: 0. LVEDP 25. Circumflex: 16. RCA 76 PDA: 0 Total CAC 104 AU, 0-25th percentile for patient's age and gender based on Hoff database. Noncardiac findings: Emphysematous changes.   )        Anesthesia Quick Evaluation

## 2022-05-25 NOTE — Progress Notes (Signed)
Anesthesia Chart Review:  Patient recently evaluated by cardiologist Dr. Terri Skains at [facility] cardiovascular for reported chest pain/heaviness. Echo noted preserved LVEF, moderate CAC per calcium score, and exercise nuclear stress test was overall low risk study.  Patient was cleared for surgery in letter dated 05/25/2022 stating, "Mark Hansen is at acceptable risk, from a cardiac standpoint, for his upcoming procedure: Abdominal aortic endovascular stent graft. If applicable can hold aspirin and cilostazol for 7  day(s) prior to procedure and re-start as per your discretion when appropriate hemostasis is achieved."  Follows with pulmonology for history of DOE and possible COPD.  He is a former smoker, 35 pack years, quit 2010.  Maintained on Trelegy Ellipta.  Notably, PFTs 03/05/2022 were within normal limits.  Preop labs reviewed, unremarkable.  EKG 01/27/2022: Normal sinus rhythm, 83 bpm, without underlying ischemia or injury pattern.   CTA aortobifem 05/07/2022: IMPRESSION: VASCULAR   1. 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 2. Otherwise, scattered atherosclerotic plaque without additional significant stenosis or occlusion.   NON-VASCULAR   1. Moderately advanced centrilobular pulmonary emphysema. 2. Additional ancillary findings as above.  Echocardiogram 01/30/2022: Normal LV systolic function with visual EF 60-65%. Left ventricle cavity is normal in size. Normal left ventricular wall thickness. Normal global wall motion. Normal diastolic filling pattern, normal LAP.  Mild tricuspid regurgitation. No evidence of pulmonary hypertension. No prior study for comparison.    Stress Testing 02/05/2022: Myocardial perfusion is normal. Overall LV systolic function is normal without regional wall motion abnormalities. Stress LV EF: 68%.  Normal  ECG stress. Exercise nuclear stress test was performed using Bruce protocol. Patient reached 6 METS, and 86% of age predicted maximum heart rate. Exercise capacity was low. The heart rate response was normal. The baseline blood pressure was 160/90 mmHg and increased to 170/98 mmHg, which is a normal response to exercise.  No previous exam available for comparison. Low risk study.   CT Cardiac Scoring 02/02/2022 at [facility]: Left Main: 0. LVEDP 25. Circumflex: 16. RCA 26 PDA: 0 Total CAC 104 AU, 0-25th percentile for patient's age and gender based on Hoff database. Noncardiac findings: Emphysematous changes.     Wynonia Musty [facility] Short Stay Center/Anesthesiology Phone [phone] 05/25/2022 4:28 PM

## 2022-05-27 ENCOUNTER — Encounter: Payer: Self-pay | Admitting: Pulmonary Disease

## 2022-05-27 ENCOUNTER — Ambulatory Visit (INDEPENDENT_AMBULATORY_CARE_PROVIDER_SITE_OTHER): Payer: PPO | Admitting: Pulmonary Disease

## 2022-05-27 VITALS — BP 120/68 | HR 95 | Temp 98.6°F | Wt 115.6 lb

## 2022-05-27 DIAGNOSIS — R0609 Other forms of dyspnea: Secondary | ICD-10-CM

## 2022-05-27 NOTE — Patient Instructions (Signed)
Nice to see you again  I provided samples of Trelegy, continue to use, it is okay to stop few days after your surgery if you are feeling well.  Return to clinic in 3 months or sooner if needed with Dr. Silas Flood

## 2022-05-29 ENCOUNTER — Telehealth: Payer: Self-pay

## 2022-05-29 NOTE — Telephone Encounter (Signed)
Spoke with patient about vein and vascular procedure clearance.Per vein and vascular specialists he is to continue taking all his medications including his aspirin. Patient accepted risks and had no further questions.

## 2022-06-01 ENCOUNTER — Inpatient Hospital Stay (HOSPITAL_COMMUNITY)
Admission: RE | Admit: 2022-06-01 | Discharge: 2022-06-02 | DRG: 268 | Disposition: A | Discharge status: Home / Self Care | Payer: Medicare HMO | Attending: Vascular Surgery | Admitting: Vascular Surgery

## 2022-06-01 ENCOUNTER — Encounter (HOSPITAL_COMMUNITY): Payer: Self-pay | Admitting: Vascular Surgery

## 2022-06-01 ENCOUNTER — Other Ambulatory Visit: Payer: Self-pay

## 2022-06-01 ENCOUNTER — Inpatient Hospital Stay (HOSPITAL_COMMUNITY): Payer: Medicare HMO | Admitting: Physician Assistant

## 2022-06-01 ENCOUNTER — Inpatient Hospital Stay (HOSPITAL_COMMUNITY): Payer: Medicare HMO

## 2022-06-01 ENCOUNTER — Encounter (HOSPITAL_COMMUNITY)
Admission: RE | Disposition: A | Discharge status: Home / Self Care | Payer: Self-pay | Source: Home / Self Care | Attending: Vascular Surgery

## 2022-06-01 DIAGNOSIS — I70223 Atherosclerosis of native arteries of extremities with rest pain, bilateral legs: Secondary | ICD-10-CM | POA: Diagnosis not present

## 2022-06-01 DIAGNOSIS — K219 Gastro-esophageal reflux disease without esophagitis: Secondary | ICD-10-CM | POA: Diagnosis not present

## 2022-06-01 DIAGNOSIS — I70213 Atherosclerosis of native arteries of extremities with intermittent claudication, bilateral legs: Secondary | ICD-10-CM

## 2022-06-01 DIAGNOSIS — I10 Essential (primary) hypertension: Secondary | ICD-10-CM

## 2022-06-01 DIAGNOSIS — F419 Anxiety disorder, unspecified: Secondary | ICD-10-CM

## 2022-06-01 DIAGNOSIS — Z87891 Personal history of nicotine dependence: Secondary | ICD-10-CM

## 2022-06-01 DIAGNOSIS — I7102 Dissection of abdominal aorta: Principal | ICD-10-CM | POA: Diagnosis present

## 2022-06-01 DIAGNOSIS — R69 Illness, unspecified: Secondary | ICD-10-CM | POA: Diagnosis not present

## 2022-06-01 DIAGNOSIS — Z8249 Family history of ischemic heart disease and other diseases of the circulatory system: Secondary | ICD-10-CM

## 2022-06-01 DIAGNOSIS — M79659 Pain in unspecified thigh: Secondary | ICD-10-CM | POA: Diagnosis not present

## 2022-06-01 HISTORY — PX: ABDOMINAL AORTIC ENDOVASCULAR STENT GRAFT: SHX5707

## 2022-06-01 LAB — BASIC METABOLIC PANEL
Anion gap: 11 (ref 5–15)
BUN: 12 mg/dL (ref 8–23)
CO2: 24 mmol/L (ref 22–32)
Calcium: 9.4 mg/dL (ref 8.9–10.3)
Chloride: 104 mmol/L (ref 98–111)
Creatinine, Ser: 0.97 mg/dL (ref 0.61–1.24)
GFR, Estimated: >60 mL/min (ref >60)
Glucose, Bld: 110 mg/dL — ABNORMAL HIGH (ref 70–99)
Potassium: 4.5 mmol/L (ref 3.5–5.1)
Sodium: 139 mmol/L (ref 135–145)

## 2022-06-01 LAB — CBC
HCT: 54.4 % — ABNORMAL HIGH (ref 39.0–52.0)
Hemoglobin: 18.2 g/dL — ABNORMAL HIGH (ref 13.0–17.0)
MCH: 30.1 pg (ref 26.0–34.0)
MCHC: 33.5 g/dL (ref 30.0–36.0)
MCV: 89.9 fL (ref 80.0–100.0)
Platelets: 126 10*3/uL — ABNORMAL LOW (ref 150–400)
RBC: 6.05 MIL/uL — ABNORMAL HIGH (ref 4.22–5.81)
RDW: 12.2 % (ref 11.5–15.5)
WBC: 9.8 10*3/uL (ref 4.0–10.5)
nRBC: 0 % (ref 0.0–0.2)

## 2022-06-01 LAB — PREPARE RBC (CROSSMATCH)

## 2022-06-01 LAB — PROTIME-INR
INR: 1 (ref 0.8–1.2)
Prothrombin Time: 13.1 seconds (ref 11.4–15.2)

## 2022-06-01 LAB — MAGNESIUM: Magnesium: 2 mg/dL (ref 1.7–2.4)

## 2022-06-01 LAB — POCT ACTIVATED CLOTTING TIME: Activated Clotting Time: 250 seconds

## 2022-06-01 LAB — APTT: aPTT: 30 seconds (ref 24–36)

## 2022-06-01 LAB — ABO/RH: ABO/RH(D): O POS

## 2022-06-01 SURGERY — INSERTION, ENDOVASCULAR STENT GRAFT, AORTA, ABDOMINAL
Anesthesia: General

## 2022-06-01 MED ORDER — HEPARIN 6000 UNIT IRRIGATION SOLUTION
Status: AC
  Filled 2022-06-01: qty 500

## 2022-06-01 MED ORDER — PROPOFOL 10 MG/ML IV BOLUS
INTRAVENOUS | Status: AC
  Filled 2022-06-01: qty 20

## 2022-06-01 MED ORDER — ROCURONIUM BROMIDE 10 MG/ML (PF) SYRINGE
PREFILLED_SYRINGE | INTRAVENOUS | Status: DC | PRN
  Administered 2022-06-01: 60 mg via INTRAVENOUS

## 2022-06-01 MED ORDER — SUGAMMADEX SODIUM 200 MG/2ML IV SOLN
INTRAVENOUS | Status: DC | PRN
  Administered 2022-06-01: 50 mg via INTRAVENOUS
  Administered 2022-06-01: 150 mg via INTRAVENOUS

## 2022-06-01 MED ORDER — PHENYLEPHRINE 80 MCG/ML (10ML) SYRINGE FOR IV PUSH (FOR BLOOD PRESSURE SUPPORT)
PREFILLED_SYRINGE | INTRAVENOUS | Status: AC
  Filled 2022-06-01: qty 10

## 2022-06-01 MED ORDER — ASPIRIN 81 MG PO TBEC: 81.0000 mg | DELAYED_RELEASE_TABLET | Freq: Every day | ORAL | Status: DC

## 2022-06-01 MED ORDER — METOPROLOL TARTRATE 5 MG/5ML IV SOLN: 2.0000 mg | INTRAVENOUS | Status: DC | PRN

## 2022-06-01 MED ORDER — ORAL CARE MOUTH RINSE: 15.0000 mL | Freq: Once | OROMUCOSAL | Status: AC

## 2022-06-01 MED ORDER — PANTOPRAZOLE SODIUM 40 MG PO TBEC: 40.0000 mg | DELAYED_RELEASE_TABLET | Freq: Every day | ORAL | Status: DC

## 2022-06-01 MED ORDER — EPHEDRINE 5 MG/ML INJ
INTRAVENOUS | Status: AC
  Filled 2022-06-01: qty 5

## 2022-06-01 MED ORDER — ONDANSETRON HCL 4 MG/2ML IJ SOLN: 4.0000 mg | Freq: Four times a day (QID) | INTRAMUSCULAR | Status: DC | PRN

## 2022-06-01 MED ORDER — LIDOCAINE 2% (20 MG/ML) 5 ML SYRINGE
INTRAMUSCULAR | Status: AC
  Filled 2022-06-01: qty 5

## 2022-06-01 MED ORDER — ONDANSETRON HCL 4 MG/2ML IJ SOLN
INTRAMUSCULAR | Status: AC
  Filled 2022-06-01: qty 2

## 2022-06-01 MED ORDER — SODIUM CHLORIDE 0.9 % IV SOLN: 500.0000 mL | Freq: Once | INTRAVENOUS | Status: DC | PRN

## 2022-06-01 MED ORDER — ACETAMINOPHEN 325 MG PO TABS: 325.0000 mg | ORAL_TABLET | ORAL | Status: DC | PRN

## 2022-06-01 MED ORDER — ONDANSETRON HCL 4 MG/2ML IJ SOLN: 4.0000 mg | Freq: Once | INTRAMUSCULAR | Status: DC | PRN

## 2022-06-01 MED ORDER — ALUM & MAG HYDROXIDE-SIMETH 200-200-20 MG/5ML PO SUSP: 15.0000 mL | ORAL | Status: DC | PRN

## 2022-06-01 MED ORDER — CHLORHEXIDINE GLUCONATE 0.12 % MT SOLN
15.0000 mL | Freq: Once | OROMUCOSAL | Status: AC
  Administered 2022-06-01: 15 mL via OROMUCOSAL
  Filled 2022-06-01: qty 15

## 2022-06-01 MED ORDER — CLEVIDIPINE BUTYRATE 0.5 MG/ML IV EMUL
INTRAVENOUS | Status: DC | PRN
  Administered 2022-06-01: 2 mg/h via INTRAVENOUS

## 2022-06-01 MED ORDER — HYDRALAZINE HCL 20 MG/ML IJ SOLN: 5.0000 mg | INTRAMUSCULAR | Status: DC | PRN

## 2022-06-01 MED ORDER — ACETAMINOPHEN 500 MG PO TABS
1000.0000 mg | ORAL_TABLET | Freq: Once | ORAL | Status: AC
  Administered 2022-06-01: 1000 mg via ORAL
  Filled 2022-06-01: qty 2

## 2022-06-01 MED ORDER — HEPARIN SODIUM (PORCINE) 5000 UNIT/ML IJ SOLN
5000.0000 [IU] | Freq: Three times a day (TID) | INTRAMUSCULAR | Status: DC
  Administered 2022-06-01 – 2022-06-02 (×3): 5000 [IU] via SUBCUTANEOUS
  Filled 2022-06-01 (×3): qty 1

## 2022-06-01 MED ORDER — LIDOCAINE 2% (20 MG/ML) 5 ML SYRINGE
INTRAMUSCULAR | Status: DC | PRN
  Administered 2022-06-01: 80 mg via INTRAVENOUS

## 2022-06-01 MED ORDER — HEPARIN SODIUM (PORCINE) 1000 UNIT/ML IJ SOLN
INTRAMUSCULAR | Status: DC | PRN
  Administered 2022-06-01: 5000 [IU] via INTRAVENOUS

## 2022-06-01 MED ORDER — ACETAMINOPHEN 650 MG RE SUPP: 325.0000 mg | RECTAL | Status: DC | PRN

## 2022-06-01 MED ORDER — FLUTICASONE FUROATE-VILANTEROL 100-25 MCG/ACT IN AEPB
1.0000 | INHALATION_SPRAY | Freq: Every day | RESPIRATORY_TRACT | Status: DC
  Administered 2022-06-01 – 2022-06-02 (×2): 1 via RESPIRATORY_TRACT
  Filled 2022-06-01: qty 28

## 2022-06-01 MED ORDER — POTASSIUM CHLORIDE CRYS ER 20 MEQ PO TBCR: 20.0000 meq | EXTENDED_RELEASE_TABLET | Freq: Every day | ORAL | Status: DC | PRN

## 2022-06-01 MED ORDER — PHENOL 1.4 % MT LIQD: 1.0000 | OROMUCOSAL | Status: DC | PRN

## 2022-06-01 MED ORDER — CEFAZOLIN SODIUM-DEXTROSE 2-4 GM/100ML-% IV SOLN
2.0000 g | INTRAVENOUS | Status: AC
  Administered 2022-06-01: 2 g via INTRAVENOUS
  Filled 2022-06-01: qty 100

## 2022-06-01 MED ORDER — FENTANYL CITRATE (PF) 250 MCG/5ML IJ SOLN
INTRAMUSCULAR | Status: DC | PRN
  Administered 2022-06-01: 100 ug via INTRAVENOUS

## 2022-06-01 MED ORDER — ROCURONIUM BROMIDE 10 MG/ML (PF) SYRINGE
PREFILLED_SYRINGE | INTRAVENOUS | Status: AC
  Filled 2022-06-01: qty 10

## 2022-06-01 MED ORDER — HYDROMORPHONE HCL 1 MG/ML IJ SOLN
0.5000 mg | INTRAMUSCULAR | Status: DC | PRN
  Administered 2022-06-01 (×2): 1 mg via INTRAVENOUS
  Filled 2022-06-01 (×2): qty 1

## 2022-06-01 MED ORDER — CHLORHEXIDINE GLUCONATE CLOTH 2 % EX PADS: 6.0000 | MEDICATED_PAD | Freq: Once | CUTANEOUS | Status: DC

## 2022-06-01 MED ORDER — PROPOFOL 10 MG/ML IV BOLUS
INTRAVENOUS | Status: DC | PRN
  Administered 2022-06-01: 130 mg via INTRAVENOUS

## 2022-06-01 MED ORDER — DEXAMETHASONE SODIUM PHOSPHATE 10 MG/ML IJ SOLN
INTRAMUSCULAR | Status: AC
  Filled 2022-06-01: qty 1

## 2022-06-01 MED ORDER — OXYCODONE-ACETAMINOPHEN 5-325 MG PO TABS
1.0000 | ORAL_TABLET | ORAL | Status: DC | PRN
  Administered 2022-06-01: 2 via ORAL
  Filled 2022-06-01: qty 2

## 2022-06-01 MED ORDER — UMECLIDINIUM BROMIDE 62.5 MCG/ACT IN AEPB
1.0000 | INHALATION_SPRAY | Freq: Every day | RESPIRATORY_TRACT | Status: DC
  Administered 2022-06-01 – 2022-06-02 (×2): 1 via RESPIRATORY_TRACT
  Filled 2022-06-01: qty 7

## 2022-06-01 MED ORDER — MAGNESIUM SULFATE 2 GM/50ML IV SOLN: 2.0000 g | Freq: Every day | INTRAVENOUS | Status: DC | PRN

## 2022-06-01 MED ORDER — IODIXANOL 320 MG/ML IV SOLN
INTRAVENOUS | Status: DC | PRN
  Administered 2022-06-01: 78 mL via INTRA_ARTERIAL

## 2022-06-01 MED ORDER — GUAIFENESIN-DM 100-10 MG/5ML PO SYRP: 15.0000 mL | ORAL_SOLUTION | ORAL | Status: DC | PRN

## 2022-06-01 MED ORDER — FENTANYL CITRATE (PF) 250 MCG/5ML IJ SOLN
INTRAMUSCULAR | Status: AC
  Filled 2022-06-01: qty 5

## 2022-06-01 MED ORDER — DOCUSATE SODIUM 100 MG PO CAPS: 100.0000 mg | ORAL_CAPSULE | Freq: Every day | ORAL | Status: DC

## 2022-06-01 MED ORDER — LABETALOL HCL 5 MG/ML IV SOLN: 10.0000 mg | INTRAVENOUS | Status: DC | PRN

## 2022-06-01 MED ORDER — SODIUM CHLORIDE 0.9 % IV SOLN: INTRAVENOUS | Status: DC

## 2022-06-01 MED ORDER — LACTATED RINGERS IV SOLN: INTRAVENOUS | Status: DC | PRN

## 2022-06-01 MED ORDER — HEPARIN 6000 UNIT IRRIGATION SOLUTION
Status: DC | PRN
  Administered 2022-06-01: 1

## 2022-06-01 MED ORDER — CEFAZOLIN SODIUM-DEXTROSE 2-4 GM/100ML-% IV SOLN
2.0000 g | Freq: Three times a day (TID) | INTRAVENOUS | Status: AC
  Administered 2022-06-01 (×2): 2 g via INTRAVENOUS
  Filled 2022-06-01 (×2): qty 100

## 2022-06-01 MED ORDER — ONDANSETRON HCL 4 MG/2ML IJ SOLN
INTRAMUSCULAR | Status: DC | PRN
  Administered 2022-06-01: 4 mg via INTRAVENOUS

## 2022-06-01 MED ORDER — PROTAMINE SULFATE 10 MG/ML IV SOLN
INTRAVENOUS | Status: DC | PRN
  Administered 2022-06-01: 30 mg via INTRAVENOUS

## 2022-06-01 MED ORDER — FENTANYL CITRATE (PF) 100 MCG/2ML IJ SOLN
INTRAMUSCULAR | Status: AC
  Filled 2022-06-01: qty 2

## 2022-06-01 MED ORDER — SENNOSIDES-DOCUSATE SODIUM 8.6-50 MG PO TABS: 1.0000 | ORAL_TABLET | Freq: Every evening | ORAL | Status: DC | PRN

## 2022-06-01 MED ORDER — PHENYLEPHRINE HCL-NACL 20-0.9 MG/250ML-% IV SOLN
INTRAVENOUS | Status: DC | PRN
  Administered 2022-06-01: 25 ug/min via INTRAVENOUS

## 2022-06-01 MED ORDER — PHENYLEPHRINE 80 MCG/ML (10ML) SYRINGE FOR IV PUSH (FOR BLOOD PRESSURE SUPPORT)
PREFILLED_SYRINGE | INTRAVENOUS | Status: DC | PRN
  Administered 2022-06-01 (×2): 80 ug via INTRAVENOUS

## 2022-06-01 MED ORDER — DEXAMETHASONE SODIUM PHOSPHATE 10 MG/ML IJ SOLN
INTRAMUSCULAR | Status: DC | PRN
  Administered 2022-06-01: 8 mg via INTRAVENOUS

## 2022-06-01 MED ORDER — GLYCOPYRROLATE 0.2 MG/ML IJ SOLN
INTRAMUSCULAR | Status: DC | PRN
  Administered 2022-06-01: .2 mg via INTRAVENOUS

## 2022-06-01 MED ORDER — ROSUVASTATIN CALCIUM 20 MG PO TABS
20.0000 mg | ORAL_TABLET | Freq: Every evening | ORAL | Status: DC
  Administered 2022-06-01: 20 mg via ORAL
  Filled 2022-06-01: qty 1

## 2022-06-01 MED ORDER — FENTANYL CITRATE (PF) 100 MCG/2ML IJ SOLN
25.0000 ug | INTRAMUSCULAR | Status: DC | PRN
  Administered 2022-06-01: 25 ug via INTRAVENOUS
  Administered 2022-06-01: 50 ug via INTRAVENOUS
  Administered 2022-06-01: 25 ug via INTRAVENOUS
  Administered 2022-06-01: 50 ug via INTRAVENOUS

## 2022-06-01 MED ORDER — LACTATED RINGERS IV SOLN: INTRAVENOUS | Status: DC

## 2022-06-01 MED ORDER — CLEVIDIPINE BUTYRATE 0.5 MG/ML IV EMUL
INTRAVENOUS | Status: AC
  Filled 2022-06-01: qty 50

## 2022-06-01 MED ORDER — BISACODYL 5 MG PO TBEC: 5.0000 mg | DELAYED_RELEASE_TABLET | Freq: Every day | ORAL | Status: DC | PRN

## 2022-06-01 SURGICAL SUPPLY — 57 items
BAG COUNTER SPONGE SURGICOUNT (BAG) ×1 IMPLANT
BALLN MUSTANG 12.0X40 135 (BALLOONS) ×1
BALLN MUSTANG 12.0X40 75 (BALLOONS) ×1
BALLOON MUSTANG 12.0X40 135 (BALLOONS) IMPLANT
BALLOON MUSTANG 12.0X40 75 (BALLOONS) IMPLANT
CANISTER SUCT 3000ML PPV (MISCELLANEOUS) ×1 IMPLANT
CATH BEACON 5 .035 65 KMP TIP (CATHETERS) IMPLANT
CATH BEACON 5.038 65CM KMP-01 (CATHETERS) ×1 IMPLANT
CATH OMNI FLUSH .035X70CM (CATHETERS) ×1 IMPLANT
CLIP LIGATING EXTRA MED SLVR (CLIP) IMPLANT
CLIP LIGATING EXTRA SM BLUE (MISCELLANEOUS) IMPLANT
DERMABOND ADVANCED .7 DNX12 (GAUZE/BANDAGES/DRESSINGS) ×1 IMPLANT
DEVICE CLOSURE PERCLS PRGLD 6F (VASCULAR PRODUCTS) ×4 IMPLANT
DRSG TEGADERM 2-3/8X2-3/4 SM (GAUZE/BANDAGES/DRESSINGS) ×2 IMPLANT
ELECT REM PT RETURN 9FT ADLT (ELECTROSURGICAL) ×2
ELECTRODE REM PT RTRN 9FT ADLT (ELECTROSURGICAL) ×2 IMPLANT
EXCLDR EXT ENDO 20X4.5 15F (Endovascular Graft) ×1 IMPLANT
EXCLUDER EXT ENDO 20X4.5 15F (Endovascular Graft) IMPLANT
GAUZE SPONGE 2X2 8PLY STRL LF (GAUZE/BANDAGES/DRESSINGS) ×1 IMPLANT
GLIDEWIRE ADV .035X180CM (WIRE) ×2 IMPLANT
GLOVE BIOGEL PI IND STRL 6.5 (GLOVE) IMPLANT
GLOVE BIOGEL PI IND STRL 7.5 (GLOVE) IMPLANT
GLOVE BIOGEL PI IND STRL 8 (GLOVE) ×1 IMPLANT
GLOVE ECLIPSE 7.0 STRL STRAW (GLOVE) IMPLANT
GOWN STRL REUS W/ TWL LRG LVL3 (GOWN DISPOSABLE) ×3 IMPLANT
GOWN STRL REUS W/TWL 2XL LVL3 (GOWN DISPOSABLE) ×2 IMPLANT
GOWN STRL REUS W/TWL LRG LVL3 (GOWN DISPOSABLE) ×3
GRAFT BALLN CATH 65CM (STENTS) ×1 IMPLANT
KIT BASIN OR (CUSTOM PROCEDURE TRAY) ×1 IMPLANT
KIT DRAIN CSF ACCUDRAIN (MISCELLANEOUS) IMPLANT
KIT ENCORE 26 ADVANTAGE (KITS) IMPLANT
KIT MICROPUNCTURE NIT STIFF (SHEATH) IMPLANT
KIT TURNOVER KIT B (KITS) ×1 IMPLANT
NS IRRIG 1000ML POUR BTL (IV SOLUTION) ×1 IMPLANT
PACK ENDOVASCULAR (PACKS) ×1 IMPLANT
PAD ARMBOARD 7.5X6 YLW CONV (MISCELLANEOUS) ×2 IMPLANT
PERCLOSE PROGLIDE 6F (VASCULAR PRODUCTS) ×3
SET MICROPUNCTURE 5F STIFF (MISCELLANEOUS) ×1 IMPLANT
SHEATH BRITE TIP 8FR 23CM (SHEATH) ×1 IMPLANT
SHEATH DRYSEAL FLEX 16FR 33CM (SHEATH) IMPLANT
SHEATH PINNACLE 6F 10CM (SHEATH) IMPLANT
SHEATH PINNACLE 8F 10CM (SHEATH) ×1 IMPLANT
STENT GRAFT BALLN CATH 65CM (STENTS) ×1
STENT VIABAHN VBX 8X79X80 (Permanent Stent) IMPLANT
STENT VIABAHNBX 8X29X135 (Permanent Stent) IMPLANT
STENT VIABAHNBX 8X59X80 (Permanent Stent) IMPLANT
STOPCOCK MORSE 400PSI 3WAY (MISCELLANEOUS) ×1 IMPLANT
SUT MNCRL AB 4-0 PS2 18 (SUTURE) ×2 IMPLANT
SUT PROLENE 5 0 C 1 24 (SUTURE) IMPLANT
SUT VIC AB 2-0 CTX 36 (SUTURE) IMPLANT
SUT VIC AB 3-0 SH 27 (SUTURE)
SUT VIC AB 3-0 SH 27X BRD (SUTURE) IMPLANT
SYR 20ML LL LF (SYRINGE) ×1 IMPLANT
TOWEL GREEN STERILE (TOWEL DISPOSABLE) ×1 IMPLANT
TRAY FOLEY MTR SLVR 16FR STAT (SET/KITS/TRAYS/PACK) ×1 IMPLANT
TUBING HIGH PRESSURE 120CM (CONNECTOR) ×1 IMPLANT
WIRE BENTSON .035X145CM (WIRE) ×2 IMPLANT

## 2022-06-01 NOTE — H&P (Signed)
Office Note    Patient seen and examined in preop holding.  No complaints. No changes to medication history or physical exam since last seen in clinic. After discussing the risks and benefits of endovascular aortic repair, Mark Hansen elected to proceed.   [name] MD   HPI: Mark Hansen is a 77 y.o. (1945-06-23) male presenting in follow-up with known peripheral arterial disease and Raynaud's phenomenon.  He has had continued pain in his glutes and thighs.  He states he has had some relief using Pletal.  On meds bilateral lower extremity, lifestyle-limiting claudication continues to worsen.  At the time of our last phone visit, we discussed CT angio abdomen pelvis in an effort to define possible inflow disease as his exercise ABI had abnormal results.  On exam today, he was doing well.  He continues to complain of lifestyle limiting claudication in bilateral lower extremities.  Rest pain.  No wounds.  Originally from [place], he immig[place] in the [place]s.  He is now retired.  He continues to smoke on a daily basis.   Past Medical History:  Diagnosis Date   Anxiety    Back pain    GERD (gastroesophageal reflux disease)    Hip fracture ([place]) 02/05/2017   left   Peripheral vascular disease ([place])    Prostate disease    Thrombocytopenia ([place]) 02/05/2017    Past Surgical History:  Procedure Laterality Date   ANKLE SURGERY     bilateral ankle surgery   COLONOSCOPY  01/07/2011   EYE SURGERY     bilateral cataract removal   FEMUR IM NAIL Left 02/05/2017   Procedure: INTRAMEDULLARY (IM) NAIL FEMORAL LEFT;  Surgeon: Nicholes Stairs, MD;  Location: [facility];  Service: Orthopedics;  Laterality: Left;   OTHER SURGICAL HISTORY     unspecified type of prostate surgery    Social History   Socioeconomic History   Marital status: Widowed    Spouse name: Not on file   Number of children: 3   Years of education: Not on file   Highest education level:  Not on file  Occupational History   Occupation: Disabled    Comment: secondary to accident at work several years ago.  Tobacco Use   Smoking status: Former    Packs/day: 1.00    Years: 35.00    Total pack years: 35.00    Types: Cigarettes    Quit date: 2010    Years since quitting: 14.1   Smokeless tobacco: Never  Vaping Use   Vaping Use: Never used  Substance and Sexual Activity   Alcohol use: Not Currently    Comment: Former heavy EtOH use, denies use in past 10 years.   Drug use: No   Sexual activity: Not on file  Other Topics Concern   Not on file  Social History Narrative    Formerly worked in Academic librarian. Emigrated from [place] when he was 77 years old.    Admitted to [facility] and [place], one daughter, two sons   Former smoker   Alcohol none   Full Code.    Social Determinants of Health   Financial Resource Strain: Not on file  Food Insecurity: Not on file  Transportation Needs: Not on file  Physical Activity: Not on file  Stress: Not on file  Social Connections: Not on file  Intimate Partner Violence: Not on file    Family History  Problem Relation Age of Onset   CAD  Mother 12   Asthma Mother    Asthma Father    Alzheimer's disease Father    Emphysema Father    CAD Brother 32       requiring bypass   Kidney disease Other        cousin    Current Facility-Administered Medications  Medication Dose Route Frequency Provider Last Rate Last Admin   0.9 %  sodium chloride infusion   Intravenous Continuous [name], MD       ceFAZolin (ANCEF) IVPB 2g/100 mL premix  2 g Intravenous 30 min Pre-Op [name], MD       Chlorhexidine Gluconate Cloth 2 % PADS 6 each  6 each Topical Once [name], MD       And   Chlorhexidine Gluconate Cloth 2 % PADS 6 each  6 each Topical Once [name], MD       heparin 6000 units / NS 500 mL irrigation    PRN [name], MD   1 Application at 58/85/02 0715   lactated ringers  infusion   Intravenous Continuous Janeece Riggers, MD        No Known Allergies   REVIEW OF SYSTEMS:   [X]  denotes positive finding, [ ]  denotes negative finding Cardiac  Comments:  Chest pain or chest pressure:    Shortness of breath upon exertion:    Short of breath when lying flat:    Irregular heart rhythm:        Vascular    Pain in calf, thigh, or hip brought on by ambulation:    Pain in feet at night that wakes you up from your sleep:     Blood clot in your veins:    Leg swelling:         Pulmonary    Oxygen at home:    Productive cough:     Wheezing:         Neurologic    Sudden weakness in arms or legs:     Sudden numbness in arms or legs:     Sudden onset of difficulty speaking or slurred speech:    Temporary loss of vision in one eye:     Problems with dizziness:         Gastrointestinal    Blood in stool:     Vomited blood:         Genitourinary    Burning when urinating:     Blood in urine:        Psychiatric    Major depression:         Hematologic    Bleeding problems:    Problems with blood clotting too easily:        Skin    Rashes or ulcers:        Constitutional    Fever or chills:      PHYSICAL EXAMINATION:  Vitals:   06/01/22 0607  BP: (!) 161/84  Pulse: 71  Resp: 16  Temp: 97.7 F (36.5 C)  TempSrc: Oral  SpO2: 100%  Weight: 52.2 kg  Height: 5\' 3"  (1.6 m)     General:  WDWN in NAD; vital signs documented above Gait: Not observed HENT: WNL, normocephalic Pulmonary: normal non-labored breathing , without wheezing Cardiac: regular HR Abdomen: soft, NT, no masses Skin: without rashes Vascular Exam/Pulses:  Right Left  Radial 2+ (normal) 2+ (normal)  Ulnar 2+ (normal) 2+ (normal)  Femoral 2+ 2+  Popliteal  DP 2+ (normal) 2+ (normal)  PT     Extremities: with ischemic changes, without Gangrene , without cellulitis; without open wounds;  Musculoskeletal: no muscle wasting or atrophy  Neurologic: A&O X 3;  No  focal weakness or paresthesias are detected Psychiatric:  The pt has Normal affect.   Non-Invasive Vascular Imaging:   ABI Findings:  +---------+------------------+-----+----------+--------+  Right    Rt Pressure (mmHg)IndexWaveform  Comment   +---------+------------------+-----+----------+--------+  Brachial 164                                        +---------+------------------+-----+----------+--------+  PTA      144               0.88 monophasic          +---------+------------------+-----+----------+--------+  DP       157               0.96 monophasic          +---------+------------------+-----+----------+--------+  Great Toe35                0.21 Abnormal            +---------+------------------+-----+----------+--------+   +---------+------------------+-----+----------+-------+  Left     Lt Pressure (mmHg)IndexWaveform  Comment  +---------+------------------+-----+----------+-------+  Brachial 160                                       +---------+------------------+-----+----------+-------+  PTA      137               0.84 monophasic         +---------+------------------+-----+----------+-------+  DP       152               0.93 biphasic           +---------+------------------+-----+----------+-------+  Great Toe31                0.19 Abnormal           +---------+------------------+-----+----------+-------+     ASSESSMENT/PLAN: Braxen Dobek is a 77 y.o. male  who initially presented with concern for progression in bilateral lower extremity claudication symptoms over the last month.  The lifestyle limiting calf pain has migrated into the thighs and is present after roughly 3 minutes of ambulation.  ABIs were reviewed demonstrating monophasic waveforms bilaterally, however he has significantly decreased pressure at the level of the ankle. Exercise ABIs demonstrated significant depression with ambulation - a sign of  inflow disease.  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  Runoff appears normal.  [name] would benefit from endovascular intervention, covered stenting of the lesion in an effort to improve flow distally to alleviate his style-limiting claudication.  Juniel I had a detailed discussion regarding endovascular aortic repair using a cuff to improve flow distally.  After discussing the risk and benefits, Daronte elected to proceed.   Patient has an appointment with pulmonologist on 05/27/2022.  I looked at his previous stress echo which demonstrated low risk, but being that this is elective surgery, I asked for cardiac clearance from Dr. Terri Skains.    [name], MD [facility] [phone]

## 2022-06-01 NOTE — Anesthesia Procedure Notes (Signed)
Procedure Name: Intubation Date/Time: 06/01/2022 7:40 AM  Performed by: Heide Scales, CRNAPre-anesthesia Checklist: Patient identified, Emergency Drugs available, Suction available and Patient being monitored Patient Re-evaluated:Patient Re-evaluated prior to induction Oxygen Delivery Method: Circle system utilized Preoxygenation: Pre-oxygenation with 100% oxygen Induction Type: IV induction Ventilation: Mask ventilation without difficulty Laryngoscope Size: Mac and 4 Grade View: Grade I Tube type: Oral Tube size: 7.5 mm Number of attempts: 1 Airway Equipment and Method: Stylet and Oral airway Placement Confirmation: ETT inserted through vocal cords under direct vision, positive ETCO2 and breath sounds checked- equal and bilateral Secured at: 23 cm Tube secured with: Tape Dental Injury: Teeth and Oropharynx as per pre-operative assessment

## 2022-06-01 NOTE — Anesthesia Procedure Notes (Signed)
Arterial Line Insertion Start/End2/08/2022 7:00 AM, 06/01/2022 7:05 AM Performed by: Heide Scales, CRNA, CRNA  Patient location: Pre-op. Preanesthetic checklist: patient identified, IV checked, site marked, risks and benefits discussed, surgical consent, monitors and equipment checked, pre-op evaluation, timeout performed and anesthesia consent Lidocaine 1% used for infiltration Right, radial was placed Catheter size: 20 G Hand hygiene performed , maximum sterile barriers used  and Seldinger technique used Allen's test indicative of satisfactory collateral circulation Attempts: 1 Procedure performed without using ultrasound guided technique. Following insertion, dressing applied. Post procedure assessment: normal and unchanged  Patient tolerated the procedure well with no immediate complications.

## 2022-06-01 NOTE — Anesthesia Postprocedure Evaluation (Signed)
Anesthesia Post Note  Patient: Mark Hansen  Procedure(s) Performed: ABDOMINAL AORTIC ENDOVASCULAR STENT GRAFT     Patient location during evaluation: PACU Anesthesia Type: General Level of consciousness: awake and alert Pain management: pain level controlled Vital Signs Assessment: post-procedure vital signs reviewed and stable Respiratory status: spontaneous breathing, nonlabored ventilation, respiratory function stable and patient connected to nasal cannula oxygen Cardiovascular status: blood pressure returned to baseline and stable Postop Assessment: no apparent nausea or vomiting Anesthetic complications: no   No notable events documented.  Last Vitals:  Vitals:   06/01/22 1330 06/01/22 1500  BP: 131/66 121/74  Pulse: 85 79  Resp: 18 16  Temp:    SpO2: 91% 100%    Last Pain:  Vitals:   06/01/22 1120  TempSrc:   PainSc: [place]

## 2022-06-01 NOTE — Op Note (Signed)
NAME: Nuel Dejaynes    MRN: [id number] DOB: 07/28/1945    DATE OF OPERATION: 06/01/2022  PREOP DIAGNOSIS:    Critical limb ischemia with lifestyle-limiting claudication  POSTOP DIAGNOSIS:    Same  PROCEDURE:    58099 Percutaneous access of bilateral common femoral arteries 83382 Aortic Tube graft - 20x4.5cm 37221 bilateral common iliac stents  - Right 8x56mm  - Left 8x48mm, 8x32mm   SURGEON: [name]  ASSIST: Roxy Horseman, PA  ANESTHESIA: General  EBL: 59ml  INDICATIONS:    Mahad Newstrom is a 77 y.o. male who initially presented with concern for progression in bilateral lower extremity claudication symptoms for several months  The lifestyle limiting calf pain has migrated into the thighs and is present after roughly 3 minutes of ambulation.  ABIs were reviewed demonstrating monophasic waveforms bilaterally, however he has significantly decreased pressure at the level of the ankle. Exercise ABIs demonstrated significant depression with ambulation - a sign of inflow disease.  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  Runoff appears normal.  [name] would benefit from endovascular intervention, covered stenting of the lesion in an effort to improve flow distally to alleviate his style-limiting claudication.   Jorgen and I had a detailed discussion regarding endovascular aortic repair using a cuff to improve flow distally.  After discussing the risk and benefits, Basilio elected to proceed.    Patient has an appointment with pulmonologist on 05/27/2022.  I looked at his previous stress echo which demonstrated low risk, but being that this is elective surgery, I asked for cardiac clearance from Dr. Terri Skains. He was cleared.     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  TECHNIQUE:    Patient was brought to the OR laid in supine position.  General anesthesia was induced and patient was prepped and draped in standard fashion.  The case began with ultrasound insonation of bilateral common femoral arteries.  These were accessed in retrograde fashion using ultrasound puncture needle.  Wire was run to the aorta.  Next, the 2 Perclose devices was deployed in a preclosed fashion from the patient's right groin, followed by 1 3 close in the left groin.  After this, 8 french sheaths were inserted.  The patient was heparinized with 5000 units.  The right-sided access was dilated to a 42 [place], and the device was delivered into the infrarenal abdominal aorta.  Pigtail catheter was cleared from the patient's left side.  Aortogram with runoff.  This demonstrated a lesion.  The Gore aortic cuff was deployed in standard fashion.  Following this, a 59 mm was inserted and then delivered bilaterally.  Knees were positioned and has been inflated Special attention is still present in the hypogastric arteries bilaterally.  Follow-up angiography demonstrated an excellent result with resolution of the stenosis, however there was a small dissection appreciated at the distal portion of the left limb.  No extravasation.  In an effort to improve flow dynamics 2 11 mm Mustang balloons were brought onto the field and inflated the proximal aspect in an effort to further expand the limbs and the terminal aorta.  Follow-up angiography demonstrates excellent result.  I elected to extend the left side and stented using an 8 x 39 mm VBX to exclude a small dissection.  Follow-up angiography demonstrated excellent result.   Impression:  Successful CERAB using a 20 mm gore aortic cuff, 70mm common iliac artery limbs dilated proximally to 21mm.  Bilateral groins were closed without issue.  Flow completion angiography of bilateral common femoral arteries and distally in the superficial femoral and profundas.   Macie Burows, MD [facility] DATE OF DICTATION:   06/01/2022

## 2022-06-01 NOTE — Transfer of Care (Signed)
Immediate Anesthesia Transfer of Care Note  Patient: Mark Hansen  Procedure(s) Performed: ABDOMINAL AORTIC ENDOVASCULAR STENT GRAFT  Patient Location: PACU  Anesthesia Type:General  Level of Consciousness: awake, alert , and oriented  Airway & Oxygen Therapy: Patient Spontanous Breathing  Post-op Assessment: Report given to RN, Post -op Vital signs reviewed and stable, and Patient moving all extremities X 4  Post vital signs: Reviewed and stable  Last Vitals:  Vitals Value Taken Time  BP 167/91 06/01/22 0916  Temp    Pulse 80 06/01/22 0920  Resp 19 06/01/22 0920  SpO2 100 % 06/01/22 0920  Vitals shown include unvalidated device data.  Last Pain:  Vitals:   06/01/22 0607  TempSrc: Oral  PainSc: 0-No pain         Complications: No notable events documented.

## 2022-06-01 NOTE — Progress Notes (Signed)
Doing well postop Back pain improving. Stable vitals.  No hematoma, palpable distal pulses.   Mark Hansen

## 2022-06-02 LAB — CBC
HCT: 42.9 % (ref 39.0–52.0)
Hemoglobin: 14.8 g/dL (ref 13.0–17.0)
MCH: 30.1 pg (ref 26.0–34.0)
MCHC: 34.5 g/dL (ref 30.0–36.0)
MCV: 87.4 fL (ref 80.0–100.0)
Platelets: 156 10*3/uL (ref 150–400)
RBC: 4.91 MIL/uL (ref 4.22–5.81)
RDW: 12.1 % (ref 11.5–15.5)
WBC: 10.1 10*3/uL (ref 4.0–10.5)
nRBC: 0 % (ref 0.0–0.2)

## 2022-06-02 LAB — BASIC METABOLIC PANEL
Anion gap: 7 (ref 5–15)
BUN: 14 mg/dL (ref 8–23)
CO2: 28 mmol/L (ref 22–32)
Calcium: 8.6 mg/dL — ABNORMAL LOW (ref 8.9–10.3)
Chloride: 102 mmol/L (ref 98–111)
Creatinine, Ser: 1.06 mg/dL (ref 0.61–1.24)
GFR, Estimated: >60 mL/min (ref >60)
Glucose, Bld: 141 mg/dL — ABNORMAL HIGH (ref 70–99)
Potassium: 4.4 mmol/L (ref 3.5–5.1)
Sodium: 137 mmol/L (ref 135–145)

## 2022-06-02 MED ORDER — CLOPIDOGREL BISULFATE 75 MG PO TABS: 75.0000 mg | ORAL_TABLET | Freq: Every day | ORAL | 11 refills | Status: DC

## 2022-06-02 MED ORDER — OXYCODONE-ACETAMINOPHEN 5-325 MG PO TABS: 1.0000 | ORAL_TABLET | Freq: Four times a day (QID) | ORAL | 0 refills | Status: AC | PRN

## 2022-06-02 MED ORDER — CLOPIDOGREL BISULFATE 75 MG PO TABS: 150.0000 mg | ORAL_TABLET | Freq: Once | ORAL | Status: DC

## 2022-06-02 NOTE — Progress Notes (Addendum)
  Progress Note    06/02/2022 6:44 AM 1 Day Post-Op  Subjective:  says he has walked in the hallways.    Afebrile HR 60's-90's NSR 854'O-270'J systolic 500% [id number]  Gtts:  none  Vitals:   06/01/22 2359 06/02/22 0300  BP: 116/65 (!) 111/58  Pulse: 70 65  Resp: 18 20  Temp: 97.6 F (36.4 C) (!) 97.4 F (36.3 C)  SpO2: 100% 98%    Physical Exam: General:  no distress; resting in bed Cardiac:  regular Lungs:  non labored  Incisions:  bilateral groins are soft without hematoma Extremities:  palpable left DP and right AT; bilateral feet are warm and well perfused.  Abdomen:  soft  CBC    Component Value Date/Time   WBC 10.1 06/02/2022 0118   RBC 4.91 06/02/2022 0118   HGB 14.8 06/02/2022 0118   HCT 42.9 06/02/2022 0118   PLT 156 06/02/2022 0118   MCV 87.4 06/02/2022 0118   MCH 30.1 06/02/2022 0118   MCHC 34.5 06/02/2022 0118   RDW 12.1 06/02/2022 0118   LYMPHSABS 1.3 06/19/2012 1352   MONOABS 0.3 06/19/2012 1352   EOSABS 0.1 06/19/2012 1352   BASOSABS 0.0 06/19/2012 1352    BMET    Component Value Date/Time   NA 137 06/02/2022 0118   NA 141 03/09/2022 0836   K 4.4 06/02/2022 0118   CL 102 06/02/2022 0118   CO2 28 06/02/2022 0118   GLUCOSE 141 (H) 06/02/2022 0118   BUN 14 06/02/2022 0118   BUN 22 03/09/2022 0836   CREATININE 1.06 06/02/2022 0118   CALCIUM 8.6 (L) 06/02/2022 0118   GFRNONAA >60 06/02/2022 0118   GFRAA >60 02/07/2017 0527    INR    Component Value Date/Time   INR 1.0 06/01/2022 1019     Intake/Output Summary (Last 24 hours) at 06/02/2022 0644 Last data filed at 06/02/2022 0326 Gross per 24 hour  Intake 900 ml  Output 1970 ml  Net -1070 ml      Assessment/Plan:  77 y.o. 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   -needs to void.   -DVT  prophylaxis:  sq heparin -continue asa/statin -discharge later today-he will f/u with Dr. Virl Cagey in 4 weeks with CTA a/p   Leontine Locket, PA-C [facility] [phone] 06/02/2022 6:44 AM  VASCULAR STAFF ADDENDUM: I have independently interviewed and examined the patient. I agree with the above.  Home today. Can be followed up with aortic duplex ultrasound, ABI. No need for CTA as no aneurysm present  Needs ASA, PLAVIX, statin for d/c  Needs pcp follow up for essential hypertension    Cassandria Santee, MD [facility] Phone Number: [phone] 06/02/2022 7:55 AM

## 2022-06-02 NOTE — Discharge Summary (Signed)
[facility] Discharge Summary   Mark Hansen 1945-11-01 77 y.o. 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 male presenting in follow-up with known peripheral arterial disease and Raynaud's phenomenon.  He has had continued pain in his glutes and thighs.  He states he has had some relief using Pletal.   On meds bilateral lower extremity, lifestyle-limiting claudication continues to worsen.  At the time of our last phone visit, we discussed CT angio abdomen pelvis in an effort to define possible inflow disease as his exercise ABI had abnormal results.   On exam today, he was doing well.  He continues to complain of lifestyle limiting claudication in bilateral lower extremities.  Rest pain.  No wounds.   Originally from [place], he immig[place] in the [place]s.  He is now retired.  He continues to smoke on a daily basis.  Hospital Course:  The patient was admitted to the hospital and taken to the operating room on 06/01/2022 and underwent: 34713 Percutaneous access of bilateral common femoral arteries 34707 Aortic Tube graft - 20x4.5cm 37221 bilateral common iliac stents             - Right 8x110mm             - Left 8x10mm, 8x90mm    Findings: Severe stenosis of the terminal aorta measuring roughly 90%   The pt tolerated the procedure well and was transported to the PACU in good condition.   By POD 1, he was doing well with palpable pedal pulses.  His bilateral groins were soft without hematoma.  He has ambulated in the hallways.  He is discharged home on asa/plavix/statin.     CBC    Component Value Date/Time   WBC 10.1 06/02/2022 0118   RBC 4.91 06/02/2022 0118   HGB 14.8 06/02/2022 0118   HCT 42.9 06/02/2022 0118   PLT 156 06/02/2022 0118   MCV 87.4 06/02/2022 0118   MCH 30.1 06/02/2022 0118   MCHC  34.5 06/02/2022 0118   RDW 12.1 06/02/2022 0118   LYMPHSABS 1.3 06/19/2012 1352   MONOABS 0.3 06/19/2012 1352   EOSABS 0.1 06/19/2012 1352   BASOSABS 0.0 06/19/2012 1352    BMET    Component Value Date/Time   NA 137 06/02/2022 0118   NA 141 03/09/2022 0836   K 4.4 06/02/2022 0118   CL 102 06/02/2022 0118   CO2 28 06/02/2022 0118   GLUCOSE 141 (H) 06/02/2022 0118   BUN 14 06/02/2022 0118   BUN 22 03/09/2022 0836   CREATININE 1.06 06/02/2022 0118   CALCIUM 8.6 (L) 06/02/2022 0118   GFRNONAA >60 06/02/2022 0118   GFRAA >60 02/07/2017 0527       Discharge Instructions     Discharge patient   Complete by: As directed    Discharge pt after he has ambulated, eaten breakfast and voided.  Thanks.   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]  Secondary Diagnosis: Patient Active Problem List   Diagnosis Date Noted   Aortic dissection, abdominal ([place]) 06/01/2022   Acute blood loss as cause of postoperative anemia 02/10/2017   Back pain 02/10/2017   Hip fracture ([place]) 02/05/2017   Elevated bilirubin 02/05/2017   Thrombocytopenia ([place])  02/05/2017   Chest pain 06/19/2012   Anxiety    GERD (gastroesophageal reflux disease)    Peripheral vascular disease ([place])    Prostate disease    Past Medical History:  Diagnosis Date   Anxiety    Back pain    GERD (gastroesophageal reflux disease)    Hip fracture ([place]) 02/05/2017   left   Peripheral vascular disease ([facility])    Prostate disease    Thrombocytopenia ([place]) 02/05/2017     Allergies as of 06/02/2022   No Known Allergies      Medication List     TAKE these medications    aspirin EC 81 MG tablet Take 1 tablet (81 mg total) by mouth daily. Swallow whole.   ibuprofen 400 MG tablet Commonly known as: ADVIL Take 1 tablet (400 mg total) by mouth every 8 (eight) hours as needed for moderate pain.    losartan 50 MG tablet Commonly known as: COZAAR Take 1 tablet (50 mg total) by mouth every morning.   NIFEdipine 30 MG 24 hr tablet Commonly known as: PROCARDIA-XL/NIFEDICAL-XL Take 1 tablet (30 mg total) by mouth daily.   oxyCODONE-acetaminophen 5-325 MG tablet Commonly known as: Percocet Take 1 tablet by mouth every 6 (six) hours as needed for severe pain.   rosuvastatin 20 MG tablet Commonly known as: CRESTOR Take 1 tablet (20 mg total) by mouth at bedtime.   Trelegy Ellipta 100-62.5-25 MCG/ACT Aepb Generic drug: Fluticasone-Umeclidin-Vilant Inhale 1 puff into the lungs daily. What changed:  when to take this reasons to take this        Discharge Instructions:  [facility]  Discharge Instructions Endovascular Aortic Aneurysm Repair  Please refer to the following instructions for your post-procedure care. Your surgeon or Physician Assistant will discuss any changes with you.  Activity  You are encouraged to walk as much as you can. You can slowly return to normal activities but must avoid strenuous activity and heavy lifting until your doctor tells you it's OK. Avoid activities such as vacuuming or swinging a gold club. It is normal to feel tired for several weeks after your surgery. Do not drive until your doctor gives the OK and you are no longer taking prescription pain medications. It is also normal to have difficulty with sleep habits, eating, and bowel movements after surgery. These will go away with time.  Bathing/Showering  You may shower after you go home. If you have an incision, do not soak in a bathtub, hot tub, or swim until the incision heals completely.  Incision Care  Shower every day. Clean your incision with mild soap and water. Pat the area dry with a clean towel. You do not need a bandage unless otherwise instructed. Do not apply any ointments or creams to your incision. If you clothing is irritating, you may cover  your incision with a dry gauze pad.  Diet  Resume your normal diet. There are no special food restrictions following this procedure. A low fat/low cholesterol diet is recommended for all patients with vascular disease. In order to heal from your surgery, it is CRITICAL to get adequate nutrition. Your body requires vitamins, minerals, and protein. Vegetables are the best source of vitamins and minerals. Vegetables also provide the perfect balance of protein. Processed food has little nutritional value, so try to avoid this.  Medications  Resume taking all of your medications unless your doctor or Physician Assistnat tells you not to. If your incision is causing pain, you  may take over-the-counter pain relievers such as acetaminophen (Tylenol). If you were prescribed a stronger pain medication, please be aware these medications can cause nausea and constipation. Prevent nausea by taking the medication with a snack or meal. Avoid constipation by drinking plenty of fluids and eating foods with a high amount of fiber, such as fruits, vegetables, and grains.  Do not take Tylenol if you are taking prescription pain medications.   Follow up  [place] office will schedule a follow-up appointment with a C.T. scan 3-4 weeks after your surgery.  Please call us immediately for any of the following conditions  Severe or worsening pain in your legs or feet or in your abdomen back or chest. Increased pain, redness, drainage (pus) from your incision sit. Increased abdominal pain, bloating, nausea, vomiting or persistent diarrhea. Fever of 101 degrees or higher. Swelling in your leg (s),  Reduce your risk of vascular disease  Stop smoking. If you would like help call [facility] at 1-800-QUIT-NOW [phone]) or [place] at [phone]. Manage your cholesterol Maintain a desired weight Control your diabetes Keep your blood pressure down  If you have questions, please call the office at  [phone].   Prescriptions given: 1.  Roxicet #8 No Refill 2.  Plavix 75mg  daily  Disposition: home  Patient's condition: is Good  Follow up: 1. Dr. Virl Cagey in 4 weeks with aortoiliac duplex and ABI 2. PCP for treatment of hypertension    Leontine Locket, PA-C [facility] [phone] 06/02/2022  7:13 AM   - For VQI Registry use - Post-op:  Time to Extubation: [x]  In OR, [ ]  < 12 hrs, [ ]  12-24 hrs, [ ]  >=24 hrs Vasopressors Req. Post-op: No MI: No., [ ]  Troponin only, [ ]  EKG or Clinical New Arrhythmia: No CHF: No ICU Stay: 1 day in progressive Transfusion: No     If yes, n/a units given  Complications: Resp failure: No., [ ]  Pneumonia, [ ]  Ventilator Chg in renal function: No., [ ]  Inc. Cr > 0.5, [ ]  Temp. Dialysis,  [ ]  Permanent dialysis Leg ischemia: No., no Surgery needed, [ ]  Yes, Surgery needed,  [ ]  Amputation Bowel ischemia: No., [ ]  Medical Rx, [ ]  Surgical Rx Wound complication: No., [ ]  Superficial separation/infection, [ ]  Return to OR Return to OR: No  Return to OR for bleeding: No Stroke: No., [ ]  Minor, [ ]  Major  Discharge medications: Statin use:  Yes  ASA use:  Yes  Plavix use:  No  Beta blocker use:  No  ARB use:  Yes ACEI use:  No CCB use:  Yes

## 2022-06-02 NOTE — Progress Notes (Signed)
Discharge instructions given. Patient verbalized understanding and all questions were answered.  

## 2022-06-02 NOTE — Discharge Instructions (Signed)
  [facility] [place] [place]   Discharge Instructions  Endovascular Aortic Aneurysm Repair  Please refer to the following instructions for your post-procedure care. Your surgeon or Physician Assistant will discuss any changes with you.  Activity  You are encouraged to walk as much as you can. You can slowly return to normal activities but must avoid strenuous activity and heavy lifting until your doctor tells you it's OK. Avoid activities such as vacuuming or swinging a gold club. It is normal to feel tired for several weeks after your surgery. Do not drive until your doctor gives the OK and you are no longer taking prescription pain medications. It is also normal to have difficulty with sleep habits, eating, and bowel movements after surgery. These will go away with time.  Bathing/Showering  Shower daily after you go home.  Do not soak in a bathtub, hot tub, or swim until the incision heals completely.  If you have incisions in your groin, wash the groin wounds with soap and water daily and pat dry. (No tub bath-only shower)  Then put a dry gauze or washcloth there to keep this area dry to help prevent wound infection daily and as needed.  Do not use Vaseline or neosporin on your incisions.  Only use soap and water on your incisions and then protect and keep dry.  Incision Care  Shower every day. Clean your incision with mild soap and water. Pat the area dry with a clean towel. You do not need a bandage unless otherwise instructed. Do not apply any ointments or creams to your incision. If you clothing is irritating, you may cover your incision with a dry gauze pad.  Diet  Resume your normal diet. There are no special food restrictions following this procedure. A low fat/low cholesterol diet is recommended for all patients with vascular disease. In order to heal from your surgery, it is CRITICAL to get adequate nutrition. Your body requires vitamins, minerals, and protein.  Vegetables are the best source of vitamins and minerals. Vegetables also provide the perfect balance of protein. Processed food has little nutritional value, so try to avoid this.  Medications  Resume taking all of your medications unless your doctor or nurse practitioner tells you not to. If your incision is causing pain, you may take over-the-counter pain relievers such as acetaminophen (Tylenol). If you were prescribed a stronger pain medication, please be aware these medications can cause nausea and constipation. Prevent nausea by taking the medication with a snack or meal. Avoid constipation by drinking plenty of fluids and eating foods with a high amount of fiber, such as fruits, vegetables, and grains.  Do not take Tylenol if you are taking prescription pain medications.   Follow up  Our office will schedule a follow-up appointment with a CT scan 3-4 weeks after your surgery.  Please call us immediately for any of the following conditions  Severe or worsening pain in your legs or feet or in your abdomen back or chest. Increased pain, redness, drainage (pus) from your incision site. Increased abdominal pain, bloating, nausea, vomiting or persistent diarrhea. Fever of 101 degrees or higher. Swelling in your leg (s),  Reduce your risk of vascular disease  Stop smoking. If you would like help call [facility] at 1-800-QUIT-NOW (1-800-784-8669) or [place] at [phone]. Manage your cholesterol Maintain a desired weight Control your diabetes Keep your blood pressure down  If you have questions, please call the office at [phone].  

## 2022-06-03 LAB — TYPE AND SCREEN
ABO/RH(D): O POS
Antibody Screen: NEGATIVE
Unit division: 0
Unit division: 0
Unit division: 0
Unit division: 0

## 2022-06-03 LAB — BPAM RBC
Blood Product Expiration Date: 202403082359
Blood Product Expiration Date: 202403082359
Blood Product Expiration Date: 202403082359
Blood Product Expiration Date: 202403082359
Unit Type and Rh: 5100
Unit Type and Rh: 5100
Unit Type and Rh: 5100
Unit Type and Rh: 5100

## 2022-06-08 ENCOUNTER — Encounter (HOSPITAL_COMMUNITY): Payer: Self-pay | Admitting: Vascular Surgery

## 2022-06-09 ENCOUNTER — Encounter (HOSPITAL_COMMUNITY): Payer: Self-pay

## 2022-06-09 ENCOUNTER — Emergency Department (HOSPITAL_COMMUNITY): Admission: EM | Admit: 2022-06-09 | Payer: Medicare HMO | Source: Home / Self Care

## 2022-06-09 ENCOUNTER — Other Ambulatory Visit: Payer: Self-pay

## 2022-06-09 NOTE — ED Triage Notes (Signed)
Pt arrived POV from home c/o being in the hospital last week and having blood taken out of his right hand. Pt has a bruise on his right hand where he had blood taken stating the bruise is not going away and now his pinky finger and ring finger are tingling.

## 2022-06-09 NOTE — ED Notes (Signed)
Pt yelling at this triage nurse that the bill needs to be canceled because the hospital created this problem. Pt refusing blood work and refusing to sign the MSE.

## 2022-06-12 ENCOUNTER — Encounter (HOSPITAL_COMMUNITY): Payer: PPO

## 2022-06-12 ENCOUNTER — Ambulatory Visit: Payer: PPO | Admitting: Vascular Surgery

## 2022-06-17 ENCOUNTER — Other Ambulatory Visit: Payer: Self-pay | Admitting: *Deleted

## 2022-06-17 DIAGNOSIS — I739 Peripheral vascular disease, unspecified: Secondary | ICD-10-CM

## 2022-06-17 DIAGNOSIS — I70213 Atherosclerosis of native arteries of extremities with intermittent claudication, bilateral legs: Secondary | ICD-10-CM

## 2022-07-03 ENCOUNTER — Ambulatory Visit (HOSPITAL_COMMUNITY)
Admission: RE | Admit: 2022-07-03 | Discharge: 2022-07-03 | Disposition: A | Discharge status: Home / Self Care | Payer: Medicare HMO | Source: Ambulatory Visit | Attending: Vascular Surgery | Admitting: Vascular Surgery

## 2022-07-03 ENCOUNTER — Ambulatory Visit (INDEPENDENT_AMBULATORY_CARE_PROVIDER_SITE_OTHER)
Admission: RE | Admit: 2022-07-03 | Discharge: 2022-07-03 | Disposition: A | Discharge status: Home / Self Care | Payer: Medicare HMO | Source: Ambulatory Visit | Attending: Vascular Surgery | Admitting: Vascular Surgery

## 2022-07-03 ENCOUNTER — Ambulatory Visit (INDEPENDENT_AMBULATORY_CARE_PROVIDER_SITE_OTHER): Payer: Medicare HMO | Admitting: Physician Assistant

## 2022-07-03 VITALS — BP 141/86 | HR 77 | Temp 97.9°F | Resp 16 | Ht 63.0 in | Wt 112.0 lb

## 2022-07-03 DIAGNOSIS — I70213 Atherosclerosis of native arteries of extremities with intermittent claudication, bilateral legs: Secondary | ICD-10-CM | POA: Insufficient documentation

## 2022-07-03 DIAGNOSIS — I739 Peripheral vascular disease, unspecified: Secondary | ICD-10-CM

## 2022-07-03 LAB — VAS US ABI WITH/WO TBI
Left ABI: 1.05
Right ABI: 1.06

## 2022-07-03 NOTE — Progress Notes (Signed)
POST OPERATIVE OFFICE NOTE    CC:  F/u for surgery  HPI:  Mark Hansen is a 77 y.o. male who is s/p aortic tube graft with bilateral common iliac artery stents on 06/01/2022 by Dr. Virl Cagey.  This was done for bilateral, lifestyle limiting claudication in his buttocks and thighs.  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  He returns today for follow-up.  He reports since surgery that his claudication has completely gone away.  He is able to walk to his hearts desire now and do what he needs to.  He is in very good spirits and very pleased with his outcome.   No Known Allergies  Current Outpatient Medications  Medication Sig Dispense Refill   aspirin EC 81 MG tablet Take 1 tablet (81 mg total) by mouth daily. Swallow whole. 30 tablet 12   clopidogrel (PLAVIX) 75 MG tablet Take 1 tablet (75 mg total) by mouth daily. 30 tablet 11   Fluticasone-Umeclidin-Vilant (TRELEGY ELLIPTA) 100-62.5-25 MCG/ACT AEPB Inhale 1 puff into the lungs daily. (Patient taking differently: Inhale 1 puff into the lungs daily as needed (shortness of breath).) 60 each 3   ibuprofen (ADVIL,MOTRIN) 400 MG tablet Take 1 tablet (400 mg total) by mouth every 8 (eight) hours as needed for moderate pain. (Patient not taking: Reported on 05/21/2022)     losartan (COZAAR) 50 MG tablet Take 1 tablet (50 mg total) by mouth every morning. (Patient not taking: Reported on 05/21/2022) 30 tablet 0   NIFEdipine (PROCARDIA-XL/NIFEDICAL-XL) 30 MG 24 hr tablet Take 1 tablet (30 mg total) by mouth daily. (Patient not taking: Reported on 05/21/2022) 90 tablet 0   oxyCODONE-acetaminophen (PERCOCET) 5-325 MG tablet Take 1 tablet by mouth every 6 (six) hours as needed for severe pain. 8 tablet 0   rosuvastatin (CRESTOR) 20 MG tablet Take 1 tablet (20 mg total) by mouth at bedtime. 90 tablet 0   No current facility-administered medications  for this visit.     ROS:  See HPI  Physical Exam:  Incision:  bilateral groin access sites soft without hematoma Extremities:  palpable DP pulses bilaterally Neuro: intact motor and sensation of BLE Abdomen:  soft, nontender   Studies: ABI (07/03/2022) +-------+-----------+-----------+------------+------------+  ABI/TBIToday's ABIToday's TBIPrevious ABIPrevious TBI  +-------+-----------+-----------+------------+------------+  Right 1.06       .48        .84                       +-------+-----------+-----------+------------+------------+  Left  1.05       .56        .84                       +-------+-----------+-----------+------------+------------+   Aorta/IVC/Iliac Duplex (07/03/2022) Widely patent aortic tube graft and bilateral common iliac artery stents without stenosis  Assessment/Plan:  This is a 77 y.o. male who is s/p: aortic tube graft with bilateral common iliac artery stenting on 06/01/2022 by Dr.Robins for lifestyle-limiting claudication  -The patient's claudication has completely resolved since aortoiliac stenting. He is able to walk without any pain. He does not have any rest pain or wounds  -On exam he has palpable DP pulses bilaterally.  His ABIs have improved bilaterally, 1.06 on the right and 1.05 in the left.  Duplex demonstrates a patent aortic tube graft and bilateral common iliac artery stents without stenosis -  Has bilateral groin access sites are soft without hematoma -He will follow up with our office in 6 months with repeat ABIs and aortoiliac duplex study. He will continue ASA, Plavix, and statin   Mark Serene, PA-C [facility] [phone]   Clinic MD:  Virl Cagey

## 2022-07-06 ENCOUNTER — Other Ambulatory Visit: Payer: Self-pay

## 2022-07-06 DIAGNOSIS — I70213 Atherosclerosis of native arteries of extremities with intermittent claudication, bilateral legs: Secondary | ICD-10-CM

## 2022-07-06 DIAGNOSIS — I739 Peripheral vascular disease, unspecified: Secondary | ICD-10-CM

## 2022-07-08 DIAGNOSIS — I10 Essential (primary) hypertension: Secondary | ICD-10-CM | POA: Diagnosis not present

## 2022-07-08 DIAGNOSIS — I350 Nonrheumatic aortic (valve) stenosis: Secondary | ICD-10-CM | POA: Diagnosis not present

## 2022-07-08 NOTE — Progress Notes (Signed)
$'@Patient'd$  ID: Mark Hansen, male    DOB: Jun 11, 1945, 77 y.o.   MRN: [id number]  Chief Complaint  Patient presents with   Follow-up    Pt is here for DOE follow up. Pt did PFT back in 03/04/2022. Echocardiogram was done 01/30/2022. Pt states that on CT scan they saw something and he is getting surgery on the 5th. Pt states that his inhaler is working well.     Referring provider: Lucianne Lei, MD  HPI:   77 y.o. man whom are in follow-up for evaluation of dyspnea on exertion.  Note from referring provider reviewed.   Most recent vascular surgery note reviewed.  At last visit, high suspicion for smoking-related lung disease.  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  Prescribed Trelegy.  Seems of helped a lot.  He stopped intermittently and has sustained improvement in symptoms.  These were reviewed in detail and demonstrate normal PFTs, full interpretation below.  He has upcoming surgery.  Given if he is symptoms are improved, okay to off inhalers after surgery to see if symptoms remain improved and can stay off indefinitely if so.  Stress test in ht interim WNL, TTE with mild MVR, otherwise ok on my review.  HPI at initial visit: Patient was usual state of health.  About 3 to 4 months ago noted some dyspnea on exertion.  Out of the blue.  No triggering event that he can identify.  No time of day when things are better or worse.  No position makes it better or worse.  No seasonal environmental factors he can identify that may have triggered makes things better or worse.  He was given Trelegy.  He used the samples.  He felt it helped his breathing, feel more energy etc.  He has since run out of this.  No other relieving or exacerbating factors.  Review of chest imaging reveals CTA PE protocol 05/2012 that reveals emphysema but otherwise clear lungs on my review interpretation.  Chest x-ray 01/2017 appears hyperinflated with clear lungs bilaterally on my review and interpretation.  Chest x-ray 11/2021 appears  hyperinflated with clear lungs bilaterally on my review and interpretation.  PMH: Hyperlipidemia, tobacco abuse in remission, peripheral vascular disease Surgical history: Ankle surgery, leg fracture surgery Family history: Mother with CAD, asthma, probable asthma Social history: Former smoker, 35-pack-year, quit 2010, lives in [place] / Pulmonary Flowsheets:   ACT:      No data to display           MMRC:     No data to display           Epworth:      No data to display           Tests:   FENO:  No results found for: "NITRICOXIDE"  PFT:    Latest Ref Rng & Units 03/05/2022   10:39 AM  PFT Results  FVC-Pre L 3.32   FVC-Predicted Pre % 107   FVC-Post L 3.20   FVC-Predicted Post % 104   Pre FEV1/FVC % % 74   Post FEV1/FCV % % 75   FEV1-Pre L 2.44   FEV1-Predicted Pre % 112   FEV1-Post L 2.41   DLCO uncorrected ml/min/mmHg 15.07   DLCO UNC% % 75   DLCO corrected ml/min/mmHg 15.07   DLCO COR %Predicted % 75   DLVA Predicted % 71   TLC L 5.31   TLC % Predicted % 94   RV % Predicted % 84  Personally reviewed interpreted as normal spirometry, no bronchodilator spots, lung volumes within normal notes, DLCO within normal limits  WALK:      No data to display           Imaging: Personally reviewed and as per EMR discussion this note VAS [place] ABI WITH/WO TBI  Result Date: 07/03/2022  LOWER EXTREMITY DOPPLER STUDY Patient Name:  Mark Hansen  Date of Exam:   07/03/2022 Medical Rec #: [id number]       Accession #:    [id number] Date of Birth: 1945/11/09       Patient Gender: M Patient Age:   77 years Exam Location:  [facility] Procedure:      VAS [place] ABI WITH/WO TBI Referring Phys: JOSHUA ROBINS --------------------------------------------------------------------------------  Indications: Peripheral artery disease. Patient's claudication has completely              resolved. High Risk Factors: Hypertension, past history of  smoking. Other Factors: SEE AO-ILIAC DUPLEX REPORT.  Vascular Interventions: 06/01/22 aortic tube graft and bilateral common iliac                         stents. Comparison Study: 03/13/22 (see table) Performing Technologist: Hester Mates BS, RVT, RDCS  Examination Guidelines: A complete evaluation includes at minimum, Doppler waveform signals and systolic blood pressure reading at the level of bilateral brachial, anterior tibial, and posterior tibial arteries, when vessel segments are accessible. Bilateral testing is considered an integral part of a complete examination. Photoelectric Plethysmograph (PPG) waveforms and toe systolic pressure readings are included as required and additional duplex testing as needed. Limited examinations for reoccurring indications may be performed as noted.  ABI Findings: +---------+------------------+-----+--------+--------+ Right    Rt Pressure (mmHg)IndexWaveformComment  +---------+------------------+-----+--------+--------+ Brachial 152                                     +---------+------------------+-----+--------+--------+ ATA      157               1.02 biphasic         +---------+------------------+-----+--------+--------+ PTA      163               1.06 biphasic         +---------+------------------+-----+--------+--------+ Great Toe74                0.48 Abnormal         +---------+------------------+-----+--------+--------+ +---------+------------------+-----+---------+-------+ Left     Lt Pressure (mmHg)IndexWaveform Comment +---------+------------------+-----+---------+-------+ Brachial 154                                     +---------+------------------+-----+---------+-------+ ATA      161               1.05 triphasic        +---------+------------------+-----+---------+-------+ PTA      156               1.01 biphasic         +---------+------------------+-----+---------+-------+ Great Toe86                 0.56 Abnormal         +---------+------------------+-----+---------+-------+ +-------+-----------+-----------+------------+------------+ ABI/TBIToday's ABIToday's TBIPrevious ABIPrevious TBI +-------+-----------+-----------+------------+------------+ Right  1.06       .48        .  84                      +-------+-----------+-----------+------------+------------+ Left   1.05       .56        .84                      +-------+-----------+-----------+------------+------------+  Bilateral ABIs appear increased compared to prior study on 03/13/22.  Summary: Right: Resting right ankle-brachial index is within normal range. The right toe-brachial index is abnormal. Left: Resting left ankle-brachial index is within normal range. The left toe-brachial index is abnormal. *See table(s) above for measurements and observations.  Electronically signed by Orlie Pollen on 07/03/2022 at 10:41:18 AM.    Final (Updated)    VAS US AORTA/IVC/ILIACS  Result Date: 07/03/2022 ABDOMINAL AORTA STUDY Patient Name:  Mark Hansen  Date of Exam:   07/03/2022 Medical Rec #: [id number]       Accession #:    [id number] Date of Birth: 1946-03-24       Patient Gender: M Patient Age:   55 years Exam Location:  [facility] Procedure:      VAS US AORTA/IVC/ILIACS Referring Phys: Vonna Kotyk ROBINS --------------------------------------------------------------------------------  Indications: PAD Risk Factors: Hypertension, past history of smoking. Other Factors: SEE ABI REPORT. Vascular Interventions: 06/01/22 aortic tube graft and bilateral common iliac                         stents.  Comparison Study: 05/07/2022 CTA: Extensive atherosclerotic plaque in the                   infrarenal aorta. 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 The luminal                   loss is at  least 65%. Performing Technologist: [employer] BS, RVT, RDCS  Examination Guidelines: A complete evaluation includes B-mode imaging, spectral Doppler, color Doppler, and power Doppler as needed of all accessible portions of each vessel. Bilateral testing is considered an integral part of a complete examination. Limited examinations for reoccurring indications may be performed as noted.  Abdominal Aorta Findings: +-----------+-------+----------+----------+--------+--------+--------+ Location   AP (cm)Trans (cm)PSV (cm/s)WaveformThrombusComments +-----------+-------+----------+----------+--------+--------+--------+ Proximal   2.00   2.10      56        biphasic                 +-----------+-------+----------+----------+--------+--------+--------+ Mid                         53        biphasic                 +-----------+-------+----------+----------+--------+--------+--------+ RT EIA Prox                 155       biphasic                 +-----------+-------+----------+----------+--------+--------+--------+ LT EIA Prox                 104  biphasic                 +-----------+-------+----------+----------+--------+--------+--------+ See aorta stent and bilateral common iliac artery stent tables. Aorta Stent +------------------------+--------+--------+--------+--------------------------+                         PSV cm/sStenosisWaveformComments                   +------------------------+--------+--------+--------+--------------------------+ Inflow                  61              biphasic                           +------------------------+--------+--------+--------+--------------------------+ Prox anastomosis        52              biphasic                           +------------------------+--------+--------+--------+--------------------------+ Proximal graft                                                              +------------------------+--------+--------+--------+--------------------------+ Mid graft                                                                  +------------------------+--------+--------+--------+--------------------------+ Distal graft            64              biphasicSee Right and Left common                                                  iliac artery stent tables  +------------------------+--------+--------+--------+--------------------------+ Right Limb                                                                 +------------------------+--------+--------+--------+--------------------------+ Right Distal anastomosis                                                   +------------------------+--------+--------+--------+--------------------------+ Right Outflow                                                              +------------------------+--------+--------+--------+--------------------------+ Left Limb                                                                  +------------------------+--------+--------+--------+--------------------------+  Left Distal anastomosis                                                    +------------------------+--------+--------+--------+--------------------------+ Left Outflow                                                               +------------------------+--------+--------+--------+--------------------------+ Aortic stent distally touches on the iliac stents. Right Stent(s): +-------------------+--------+--------+--------+------------+ common iliac arteryPSV cm/sStenosisWaveformComments     +-------------------+--------+--------+--------+------------+ Prox to Stent      64              biphasicdistal aorta +-------------------+--------+--------+--------+------------+ Proximal Stent     65              biphasic              +-------------------+--------+--------+--------+------------+ Mid Stent          101             biphasic             +-------------------+--------+--------+--------+------------+ Distal Stent       114             biphasic             +-------------------+--------+--------+--------+------------+ Distal to Stent    114             biphasicterminal CIA +-------------------+--------+--------+--------+------------+   Left Stent(s): +-------------------+--------+--------+--------+------------+ common iliac arteryPSV cm/sStenosisWaveformComments     +-------------------+--------+--------+--------+------------+ Prox to Stent      64              biphasicdistal aorta +-------------------+--------+--------+--------+------------+ Proximal Stent     54              biphasic             +-------------------+--------+--------+--------+------------+ Mid Stent          74              biphasic             +-------------------+--------+--------+--------+------------+ Distal Stent       105             biphasic             +-------------------+--------+--------+--------+------------+ Distal to Stent    113             biphasicterminal CIA +-------------------+--------+--------+--------+------------+    Summary: Abdominal Aorta: No evidence of an abdominal aortic aneurysm was visualized. Stenosis: Widely patent aortic tube graft and bilateral common iliac artery stents, without stenosis.  *See table(s) above for measurements and observations.  Electronically signed by Orlie Pollen on 07/03/2022 at 10:41:07 AM.    Final     Lab Results: Personally reviewed CBC    Component Value Date/Time   WBC 10.1 06/02/2022 0118   RBC 4.91 06/02/2022 0118   HGB 14.8 06/02/2022 0118   HCT 42.9 06/02/2022 0118   PLT 156 06/02/2022 0118   MCV 87.4 06/02/2022 0118   MCH 30.1 06/02/2022 0118   MCHC 34.5 06/02/2022 0118   RDW 12.1 06/02/2022 0118   LYMPHSABS 1.3 06/19/2012 1352    MONOABS 0.3 06/19/2012 1352  EOSABS 0.1 06/19/2012 1352   BASOSABS 0.0 06/19/2012 1352    BMET    Component Value Date/Time   NA 137 06/02/2022 0118   NA 141 03/09/2022 0836   K 4.4 06/02/2022 0118   CL 102 06/02/2022 0118   CO2 28 06/02/2022 0118   GLUCOSE 141 (H) 06/02/2022 0118   BUN 14 06/02/2022 0118   BUN 22 03/09/2022 0836   CREATININE 1.06 06/02/2022 0118   CALCIUM 8.6 (L) 06/02/2022 0118   GFRNONAA >60 06/02/2022 0118   GFRAA >60 02/07/2017 0527    BNP No results found for: "BNP"  ProBNP No results found for: "PROBNP"  Specialty Problems   None   No Known Allergies  Immunization History  Administered Date(s) Administered   Pneumococcal-Unspecified 04/27/2010   Tdap 04/27/1964    Past Medical History:  Diagnosis Date   Anxiety    Back pain    GERD (gastroesophageal reflux disease)    Hip fracture ([place]) 02/05/2017   left   Peripheral vascular disease ([facility])    Prostate disease    Thrombocytopenia ([place]) 02/05/2017    Tobacco History: Social History   Tobacco Use  Smoking Status Former   Packs/day: 1.00   Years: 35.00   Total pack years: 35.00   Types: Cigarettes   Quit date: 2010   Years since quitting: 14.2  Smokeless Tobacco Never   Counseling given: Not Answered   Continue to not smoke  Outpatient Encounter Medications as of 05/27/2022  Medication Sig   aspirin EC 81 MG tablet Take 1 tablet (81 mg total) by mouth daily. Swallow whole.   Fluticasone-Umeclidin-Vilant (TRELEGY ELLIPTA) 100-62.5-25 MCG/ACT AEPB Inhale 1 puff into the lungs daily. (Patient taking differently: Inhale 1 puff into the lungs daily as needed (shortness of breath).)   ibuprofen (ADVIL,MOTRIN) 400 MG tablet Take 1 tablet (400 mg total) by mouth every 8 (eight) hours as needed for moderate pain. (Patient not taking: Reported on 05/21/2022)   losartan (COZAAR) 50 MG tablet Take 1 tablet (50 mg total) by mouth every morning. (Patient not taking: Reported on  05/21/2022)   NIFEdipine (PROCARDIA-XL/NIFEDICAL-XL) 30 MG 24 hr tablet Take 1 tablet (30 mg total) by mouth daily. (Patient not taking: Reported on 05/21/2022)   rosuvastatin (CRESTOR) 20 MG tablet Take 1 tablet (20 mg total) by mouth at bedtime.   [DISCONTINUED] cilostazol (PLETAL) 100 MG tablet Take 1 tablet (100 mg total) by mouth 2 (two) times daily before a meal. (Patient not taking: Reported on 05/21/2022)   No facility-administered encounter medications on file as of 05/27/2022.     Review of Systems  Review of Systems  No chest pain with exertion.  No orthopnea or PND.  Comprehensive review of systems otherwise negative. Physical Exam  BP 120/68 (BP Location: Left Arm, Patient Position: Sitting, Cuff Size: Normal)   Pulse 95   Temp 98.6 F (37 C) (Oral)   Wt 115 lb 9.6 oz (52.4 kg)   SpO2 98%   BMI 20.48 kg/m   Wt Readings from Last 5 Encounters:  07/03/22 112 lb (50.8 kg)  06/01/22 115 lb (52.2 kg)  05/27/22 115 lb 9.6 oz (52.4 kg)  05/22/22 115 lb 1.6 oz (52.2 kg)  05/15/22 115 lb (52.2 kg)    BMI Readings from Last 5 Encounters:  07/03/22 19.84 kg/m  06/01/22 20.37 kg/m  05/27/22 20.48 kg/m  05/22/22 20.39 kg/m  05/15/22 20.37 kg/m     Physical Exam General: Sitting in exam chair, no acute distress Eyes:  EOMI, no icterus Neck: Supple, no JVP Pulmonary: Clear, distant Cardiovascular: Warm, no edema Abdomen: Nondistended, bowel sounds present MSK: No synovitis, no joint effusion Neuro: Normal gait, no weakness Psych: Normal mood, full affect   Assessment & Plan:   Dyspnea on exertion: Overall improved.  Trelegy helped.  Also sustained improvement off Trelegy at times.  Difficulty obtaining medication .  Samples today.  PFTs normal.  Okay to stop inhalers in the future if symptoms remain okay. Recent cardiology evaluation seems reassuring.  Leg pain: Bilaterally, in the ankles and legs higher up.  Sounds like claudication.  Has ongoing outpatient  evaluation with vascular surgery, planned intervention.   Return in about 3 months (around 08/25/2022).   Lanier Clam, MD 07/08/2022

## 2022-08-27 ENCOUNTER — Ambulatory Visit: Payer: PPO | Admitting: Cardiology

## 2022-09-09 ENCOUNTER — Other Ambulatory Visit: Payer: Self-pay | Admitting: Vascular Surgery

## 2022-09-09 DIAGNOSIS — I739 Peripheral vascular disease, unspecified: Secondary | ICD-10-CM

## 2022-10-16 DIAGNOSIS — E782 Mixed hyperlipidemia: Secondary | ICD-10-CM | POA: Diagnosis not present

## 2022-10-16 DIAGNOSIS — J439 Emphysema, unspecified: Secondary | ICD-10-CM | POA: Diagnosis not present

## 2022-10-16 DIAGNOSIS — R972 Elevated prostate specific antigen [PSA]: Secondary | ICD-10-CM | POA: Diagnosis not present

## 2022-10-16 DIAGNOSIS — I10 Essential (primary) hypertension: Secondary | ICD-10-CM | POA: Diagnosis not present

## 2023-01-08 ENCOUNTER — Ambulatory Visit (HOSPITAL_COMMUNITY)
Admission: RE | Admit: 2023-01-08 | Discharge: 2023-01-08 | Disposition: A | Discharge status: Home / Self Care | Payer: Medicare HMO | Source: Ambulatory Visit | Attending: Vascular Surgery | Admitting: Vascular Surgery

## 2023-01-08 ENCOUNTER — Ambulatory Visit (INDEPENDENT_AMBULATORY_CARE_PROVIDER_SITE_OTHER)
Admission: RE | Admit: 2023-01-08 | Discharge: 2023-01-08 | Disposition: A | Discharge status: Home / Self Care | Payer: Medicare HMO | Source: Ambulatory Visit | Attending: Vascular Surgery | Admitting: Vascular Surgery

## 2023-01-08 ENCOUNTER — Ambulatory Visit: Payer: Medicare HMO | Admitting: Physician Assistant

## 2023-01-08 VITALS — BP 159/86 | HR 64 | Temp 98.0°F | Resp 16 | Ht 63.0 in | Wt 114.6 lb

## 2023-01-08 DIAGNOSIS — I70213 Atherosclerosis of native arteries of extremities with intermittent claudication, bilateral legs: Secondary | ICD-10-CM | POA: Insufficient documentation

## 2023-01-08 DIAGNOSIS — I739 Peripheral vascular disease, unspecified: Secondary | ICD-10-CM

## 2023-01-08 LAB — VAS US ABI WITH/WO TBI
Left ABI: 1.02
Right ABI: 1.13

## 2023-01-08 NOTE — Progress Notes (Unsigned)
Office Note   History of Present Illness   Mark Hansen is a 77 y.o. (April 30, 1945) male who presents for surveillance of PAD. They have a history of ***  The patient returns today for follow up. He/she denies any recent medical history changes. The patient also denies any claudication, rest pain, or tissue loss of the lower extremities.  Current Outpatient Medications  Medication Sig Dispense Refill   aspirin EC 81 MG tablet Take 1 tablet (81 mg total) by mouth daily. Swallow whole. (Patient not taking: Reported on 01/08/2023) 30 tablet 12   clopidogrel (PLAVIX) 75 MG tablet Take 1 tablet (75 mg total) by mouth daily. (Patient not taking: Reported on 01/08/2023) 30 tablet 11   Fluticasone-Umeclidin-Vilant (TRELEGY ELLIPTA) 100-62.5-25 MCG/ACT AEPB Inhale 1 puff into the lungs daily. (Patient not taking: Reported on 01/08/2023) 60 each 3   ibuprofen (ADVIL,MOTRIN) 400 MG tablet Take 1 tablet (400 mg total) by mouth every 8 (eight) hours as needed for moderate pain. (Patient not taking: Reported on 05/21/2022)     losartan (COZAAR) 50 MG tablet Take 1 tablet (50 mg total) by mouth every morning. (Patient not taking: Reported on 05/21/2022) 30 tablet 0   NIFEdipine (PROCARDIA-XL/NIFEDICAL-XL) 30 MG 24 hr tablet Take 1 tablet (30 mg total) by mouth daily. (Patient not taking: Reported on 05/21/2022) 90 tablet 0   oxyCODONE-acetaminophen (PERCOCET) 5-325 MG tablet Take 1 tablet by mouth every 6 (six) hours as needed for severe pain. (Patient not taking: Reported on 07/03/2022) 8 tablet 0   rosuvastatin (CRESTOR) 20 MG tablet Take 1 tablet (20 mg total) by mouth at bedtime. 90 tablet 0   No current facility-administered medications for this visit.    ***REVIEW OF SYSTEMS (negative unless checked):   Cardiac:  []  Chest pain or chest pressure? []  Shortness of breath upon activity? []  Shortness of breath when lying flat? []  Irregular heart rhythm?  Vascular:  []  Pain in calf, thigh, or hip  brought on by walking? []  Pain in feet at night that wakes you up from your sleep? []  Blood clot in your veins? []  Leg swelling?  Pulmonary:  []  Oxygen at home? []  Productive cough? []  Wheezing?  Neurologic:  []  Sudden weakness in arms or legs? []  Sudden numbness in arms or legs? []  Sudden onset of difficult speaking or slurred speech? []  Temporary loss of vision in one eye? []  Problems with dizziness?  Gastrointestinal:  []  Blood in stool? []  Vomited blood?  Genitourinary:  []  Burning when urinating? []  Blood in urine?  Psychiatric:  []  Major depression  Hematologic:  []  Bleeding problems? []  Problems with blood clotting?  Dermatologic:  []  Rashes or ulcers?  Constitutional:  []  Fever or chills?  Ear/Nose/Throat:  []  Change in hearing? []  Nose bleeds? []  Sore throat?  Musculoskeletal:  []  Back pain? []  Joint pain? []  Muscle pain?   Physical Examination  *** Vitals:   01/08/23 0947  BP: (!) 159/86  Pulse: 64  Resp: 16  Temp: 98 F (36.7 C)  TempSrc: Temporal  SpO2: 97%  Weight: 114 lb 9.6 oz (52 kg)  Height: 5\' 3"  (1.6 m)   ***Body mass index is 20.3 kg/m.  General:  WDWN in NAD; vital signs documented above Gait: Not observed HENT: WNL, normocephalic Pulmonary: normal non-labored breathing , without rales, rhonchi,  wheezing Cardiac: {Desc; regular/irreg:14544} HR, without murmurs {With/Without:20273} carotid bruit*** Abdomen: soft, NT, no masses Skin: {With/Without:20273} rashes Vascular Exam/Pulses: Palpable/nonpalpable femoral pulses, palpable/nonpalpable popliteal pulses, palpable/nonpalpable pedal pulses.  Left DP/PT/Peroneal doppler signals. Right DP/PT/Peroneal doppler signals Extremities: {With/Without:20273} ischemic changes, {With/Without:20273} gangrene , {With/Without:20273} cellulitis; {With/Without:20273} open wounds;  Musculoskeletal: no muscle wasting or atrophy  Neurologic: A&O X 3;  No focal weakness or paresthesias are  detected Psychiatric:  The pt has {Desc; normal/abnormal:11317::"Normal"} affect.  Non-Invasive Vascular imaging   ABI (***) R:  ABI: *** (***),  PT: {Signals:19197::"none","mono","bi","tri"} DP: {Signals:19197::"none","mono","bi","tri"} TBI:  *** L:  ABI: *** (***),  PT: {Signals:19197::"none","mono","bi","tri"} DP: {Signals:19197::"none","mono","bi","tri"} TBI: ***  Aortoiliac Duplex (***)   *** Arterial Duplex (***)   Medical Decision Making   Mark Hansen is a 77 y.o. male who presents for surveillance of PAD  Based on the patient's vascular studies, their ABIs are essentially unchanged since last visit. *** Arterial duplex *** The patient denies any claudication, rest pain, or tissue loss. They have palpable/nonpalpable pedal pulses with *** doppler signals They will continue their *** and follow up with our office in *** months/year with ABIs and ***   Loel Dubonnet PA-C [facility] Office: [phone]  Clinic MD: ***

## 2023-01-15 ENCOUNTER — Other Ambulatory Visit: Payer: Self-pay

## 2023-01-15 DIAGNOSIS — Z23 Encounter for immunization: Secondary | ICD-10-CM | POA: Diagnosis not present

## 2023-01-15 DIAGNOSIS — E782 Mixed hyperlipidemia: Secondary | ICD-10-CM | POA: Diagnosis not present

## 2023-01-15 DIAGNOSIS — I739 Peripheral vascular disease, unspecified: Secondary | ICD-10-CM | POA: Diagnosis not present

## 2023-01-15 DIAGNOSIS — I10 Essential (primary) hypertension: Secondary | ICD-10-CM | POA: Diagnosis not present

## 2023-01-15 DIAGNOSIS — I70213 Atherosclerosis of native arteries of extremities with intermittent claudication, bilateral legs: Secondary | ICD-10-CM

## 2023-01-30 ENCOUNTER — Encounter (HOSPITAL_COMMUNITY): Payer: Self-pay

## 2023-01-30 ENCOUNTER — Other Ambulatory Visit: Payer: Self-pay

## 2023-01-30 ENCOUNTER — Observation Stay (HOSPITAL_COMMUNITY)
Admission: EM | Admit: 2023-01-30 | Discharge: 2023-01-31 | Disposition: A | Discharge status: Home / Self Care | Payer: Medicare HMO | Attending: Urology | Admitting: Urology

## 2023-01-30 DIAGNOSIS — Z79899 Other long term (current) drug therapy: Secondary | ICD-10-CM | POA: Diagnosis not present

## 2023-01-30 DIAGNOSIS — Z7982 Long term (current) use of aspirin: Secondary | ICD-10-CM | POA: Diagnosis not present

## 2023-01-30 DIAGNOSIS — R310 Gross hematuria: Principal | ICD-10-CM | POA: Insufficient documentation

## 2023-01-30 DIAGNOSIS — Z7902 Long term (current) use of antithrombotics/antiplatelets: Secondary | ICD-10-CM | POA: Diagnosis not present

## 2023-01-30 DIAGNOSIS — N320 Bladder-neck obstruction: Secondary | ICD-10-CM | POA: Diagnosis not present

## 2023-01-30 DIAGNOSIS — I739 Peripheral vascular disease, unspecified: Secondary | ICD-10-CM | POA: Insufficient documentation

## 2023-01-30 DIAGNOSIS — Z87891 Personal history of nicotine dependence: Secondary | ICD-10-CM | POA: Diagnosis not present

## 2023-01-30 DIAGNOSIS — N3289 Other specified disorders of bladder: Secondary | ICD-10-CM | POA: Diagnosis not present

## 2023-01-30 DIAGNOSIS — Z7901 Long term (current) use of anticoagulants: Secondary | ICD-10-CM | POA: Diagnosis not present

## 2023-01-30 DIAGNOSIS — R319 Hematuria, unspecified: Secondary | ICD-10-CM | POA: Diagnosis present

## 2023-01-30 DIAGNOSIS — Z95828 Presence of other vascular implants and grafts: Secondary | ICD-10-CM | POA: Diagnosis not present

## 2023-01-30 LAB — CBC
HCT: 48.7 % (ref 39.0–52.0)
Hemoglobin: 16.5 g/dL (ref 13.0–17.0)
MCH: 29.7 pg (ref 26.0–34.0)
MCHC: 33.9 g/dL (ref 30.0–36.0)
MCV: 87.6 fL (ref 80.0–100.0)
Platelets: 166 10*3/uL (ref 150–400)
RBC: 5.56 MIL/uL (ref 4.22–5.81)
RDW: 12.3 % (ref 11.5–15.5)
WBC: 5.6 10*3/uL (ref 4.0–10.5)
nRBC: 0 % (ref 0.0–0.2)

## 2023-01-30 LAB — BASIC METABOLIC PANEL
Anion gap: 10 (ref 5–15)
BUN: 17 mg/dL (ref 8–23)
CO2: 29 mmol/L (ref 22–32)
Calcium: 9.5 mg/dL (ref 8.9–10.3)
Chloride: 101 mmol/L (ref 98–111)
Creatinine, Ser: 0.93 mg/dL (ref 0.61–1.24)
GFR, Estimated: >60 mL/min (ref >60)
Glucose, Bld: 120 mg/dL — ABNORMAL HIGH (ref 70–99)
Potassium: 4 mmol/L (ref 3.5–5.1)
Sodium: 140 mmol/L (ref 135–145)

## 2023-01-30 LAB — URINALYSIS, ROUTINE W REFLEX MICROSCOPIC
Bacteria, UA: NONE SEEN
RBC / HPF: >50 RBC/hpf (ref 0–5)

## 2023-01-30 MED ORDER — ACETAMINOPHEN 325 MG PO TABS: 650.0000 mg | ORAL_TABLET | ORAL | Status: DC | PRN

## 2023-01-30 MED ORDER — DIPHENHYDRAMINE HCL 50 MG/ML IJ SOLN: 12.5000 mg | Freq: Four times a day (QID) | INTRAMUSCULAR | Status: DC | PRN

## 2023-01-30 MED ORDER — SODIUM CHLORIDE 0.9 % IR SOLN: 3000.0000 mL | Status: DC

## 2023-01-30 MED ORDER — ONDANSETRON HCL 4 MG/2ML IJ SOLN: 4.0000 mg | INTRAMUSCULAR | Status: DC | PRN

## 2023-01-30 MED ORDER — MORPHINE SULFATE (PF) 2 MG/ML IV SOLN
2.0000 mg | Freq: Once | INTRAVENOUS | Status: AC
  Administered 2023-01-30: 2 mg via INTRAVENOUS
  Filled 2023-01-30: qty 1

## 2023-01-30 MED ORDER — DEXTROSE-SODIUM CHLORIDE 5-0.45 % IV SOLN: INTRAVENOUS | Status: DC

## 2023-01-30 MED ORDER — LOSARTAN POTASSIUM 50 MG PO TABS: 50.0000 mg | ORAL_TABLET | ORAL | Status: DC

## 2023-01-30 MED ORDER — FLUTICASONE FUROATE-VILANTEROL 100-25 MCG/ACT IN AEPB: 1.0000 | INHALATION_SPRAY | Freq: Every day | RESPIRATORY_TRACT | Status: DC

## 2023-01-30 MED ORDER — DIPHENHYDRAMINE HCL 12.5 MG/5ML PO ELIX: 12.5000 mg | ORAL_SOLUTION | Freq: Four times a day (QID) | ORAL | Status: DC | PRN

## 2023-01-30 MED ORDER — OXYCODONE-ACETAMINOPHEN 5-325 MG PO TABS: 1.0000 | ORAL_TABLET | Freq: Four times a day (QID) | ORAL | Status: DC | PRN

## 2023-01-30 MED ORDER — ORAL CARE MOUTH RINSE: 15.0000 mL | OROMUCOSAL | Status: DC | PRN

## 2023-01-30 MED ORDER — ONDANSETRON HCL 4 MG/2ML IJ SOLN
4.0000 mg | Freq: Once | INTRAMUSCULAR | Status: AC
  Administered 2023-01-30: 4 mg via INTRAVENOUS
  Filled 2023-01-30: qty 2

## 2023-01-30 MED ORDER — MORPHINE SULFATE (PF) 4 MG/ML IV SOLN
4.0000 mg | Freq: Once | INTRAVENOUS | Status: AC
  Administered 2023-01-30: 4 mg via INTRAVENOUS
  Filled 2023-01-30: qty 1

## 2023-01-30 MED ORDER — ROSUVASTATIN CALCIUM 10 MG PO TABS: 20.0000 mg | ORAL_TABLET | Freq: Every day | ORAL | Status: DC

## 2023-01-30 MED ORDER — NIFEDIPINE ER OSMOTIC RELEASE 30 MG PO TB24: 30.0000 mg | ORAL_TABLET | Freq: Every day | ORAL | Status: DC

## 2023-01-30 MED ORDER — DOCUSATE SODIUM 100 MG PO CAPS
100.0000 mg | ORAL_CAPSULE | Freq: Two times a day (BID) | ORAL | Status: DC
  Administered 2023-01-30: 100 mg via ORAL
  Filled 2023-01-30: qty 1

## 2023-01-30 NOTE — ED Provider Notes (Signed)
Patient signed out to [facility], PA-C.  Please review their note for the continuation of patient's care.  The plan at this point is likely urology try to get the Foley in and go off of the recommendations.  Upon recheck it appears that urology is admitting the patient.    Remi Deter 01/30/23 2120    Tegeler, Canary Brim, MD 01/30/23 [phone]

## 2023-01-30 NOTE — ED Provider Notes (Signed)
[place] EMERGENCY DEPARTMENT AT [facility] Provider Note   CSN: [id number] Arrival date & time: 01/30/23  1611     History  Chief Complaint  Patient presents with   Hematuria    Mark Hansen is a 77 y.o. 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  Patient reports he had episode like this many years ago and nothing was found on workup then.  Patient denies any recent trauma, injury.  He suspects that bleeding is secondary to being on the Plavix.  Patient reports no other associated symptoms.  Denies nausea vomiting or diarrhea.   Hematuria       Home Medications Prior to Admission medications   Medication Sig Start Date End Date Taking? Authorizing Provider  aspirin EC 81 MG tablet Take 1 tablet (81 mg total) by mouth daily. Swallow whole. Patient not taking: Reported on 01/08/2023 01/27/22   Tessa Lerner, DO  clopidogrel (PLAVIX) 75 MG tablet Take 1 tablet (75 mg total) by mouth daily. Patient not taking: Reported on 01/08/2023 06/02/22 06/02/23  Victorino Sparrow, MD  Fluticasone-Umeclidin-Vilant (TRELEGY ELLIPTA) 100-62.5-25 MCG/ACT AEPB Inhale 1 puff into the lungs daily. Patient not taking: Reported on 01/08/2023 01/29/22   Hunsucker, Lesia Sago, MD  ibuprofen (ADVIL,MOTRIN) 400 MG tablet Take 1 tablet (400 mg total) by mouth every 8 (eight) hours as needed for moderate pain. Patient not taking: Reported on 05/21/2022 02/08/17   Leroy Sea, MD  losartan (COZAAR) 50 MG tablet Take 1 tablet (50 mg total) by mouth every morning. Patient not taking: Reported on 05/21/2022 02/24/22 03/26/22  Odis Hollingshead, Sunit, DO  NIFEdipine (PROCARDIA-XL/NIFEDICAL-XL) 30 MG 24 hr tablet Take 1 tablet (30 mg total) by mouth daily. Patient not taking: Reported on 05/21/2022 12/26/21   Victorino Sparrow, MD  oxyCODONE-acetaminophen (PERCOCET)  5-325 MG tablet Take 1 tablet by mouth every 6 (six) hours as needed for severe pain. Patient not taking: Reported on 07/03/2022 06/02/22   Dara Lords, PA-C  rosuvastatin (CRESTOR) 20 MG tablet Take 1 tablet (20 mg total) by mouth at bedtime. 01/27/22 06/01/22  Tessa Lerner, DO      Allergies    Patient has no known allergies.    Review of Systems   Review of Systems  Genitourinary:  Positive for hematuria.    Physical Exam Updated Vital Signs BP (!) 181/87 (BP Location: Right Arm)   Pulse 96   Temp 97.8 F (36.6 C) (Oral)   Resp 17   Ht 5\' 3"  (1.6 m)   Wt 51.7 kg   SpO2 100%   BMI 20.19 kg/m  Physical Exam Vitals and nursing note reviewed.  Constitutional:      General: He is not in acute distress.    Appearance: He is not toxic-appearing.  HENT:     Head: Normocephalic and atraumatic.  Eyes:     General: No scleral icterus.    Conjunctiva/sclera: Conjunctivae normal.  Cardiovascular:     Rate and Rhythm: Normal rate and regular rhythm.     Pulses: Normal pulses.     Heart sounds: Normal heart sounds.  Pulmonary:     Effort: Pulmonary effort is normal. No respiratory distress.     Breath sounds: Normal breath sounds.  Abdominal:     General: Abdomen is flat. Bowel sounds are normal. There is distension.  Palpations: Abdomen is soft. There is no mass.     Tenderness: There is abdominal tenderness.     Comments: Severe suprapubic pain, bladder appears distended   Skin:    General: Skin is warm and dry.     Findings: No lesion.  Neurological:     General: No focal deficit present.     Mental Status: He is alert and oriented to person, place, and time. Mental status is at baseline.     ED Results / Procedures / Treatments   Labs (all labs ordered are listed, but only abnormal results are displayed) Labs Reviewed  URINALYSIS, ROUTINE W REFLEX MICROSCOPIC - Abnormal; Notable for the following components:      Result Value   Color, Urine RED (*)     APPearance TURBID (*)    Glucose, UA   (*)    Value: TEST NOT REPORTED DUE TO COLOR INTERFERENCE OF URINE PIGMENT   Hgb urine dipstick   (*)    Value: TEST NOT REPORTED DUE TO COLOR INTERFERENCE OF URINE PIGMENT   Bilirubin Urine   (*)    Value: TEST NOT REPORTED DUE TO COLOR INTERFERENCE OF URINE PIGMENT   Ketones, ur   (*)    Value: TEST NOT REPORTED DUE TO COLOR INTERFERENCE OF URINE PIGMENT   Protein, ur   (*)    Value: TEST NOT REPORTED DUE TO COLOR INTERFERENCE OF URINE PIGMENT   Nitrite   (*)    Value: TEST NOT REPORTED DUE TO COLOR INTERFERENCE OF URINE PIGMENT   Leukocytes,Ua   (*)    Value: TEST NOT REPORTED DUE TO COLOR INTERFERENCE OF URINE PIGMENT   All other components within normal limits  BASIC METABOLIC PANEL - Abnormal; Notable for the following components:   Glucose, Bld 120 (*)    All other components within normal limits  CBC    EKG None  Radiology No results found.  Procedures Procedures    Medications Ordered in ED Medications - No data to display  ED Course/ Medical Decision Making/ A&P Clinical Course as of 01/30/23 1942  Sat Jan 30, 2023  1940 Patient reports he is in severe suprapubic pain, he is not able to urinate and feels like he needs to urinate right now.  Unfortunately nursing staff unable to pass catheter without meeting resistance.  Given that were not able to pass catheter here, consulting urology for recommendations.  Charge nurse consulted urology nurses upstairs who are just having shift change and likely will not be able to help for some time.  [JB]    Clinical Course User Index [JB] Syan Cullimore, Horald Chestnut, PA-C                                 Medical Decision Making Amount and/or Complexity of Data Reviewed Labs: ordered.  Risk Prescription drug management.   Mark Hansen 77 y.o. presented today for suprapubic pain. Working Ddx: includes, but not limited to, gastroenteritis, colitis, appendicitis, pancreatitis,  nephrolithiasis, UTI, pyelonephritis, testicular torsion, dehydration, electrolyte abnormalities, hematuria   R/o DDx: These are considered less likely than current impression due to history of present illness, physical exam, labs/imaging findings.  Review of prior external notes:  2017 urology visit -upon chart review appears patient had transurethral resection of the prostate in 2015 for BPH with lower urinary tract symptoms.  I cannot see any further follow-ups in chart on BPH.  Patient has not  been following with anyone for BPH.  Pmhx: BPH with TURP for LUTS   Unique Tests and My Interpretation:  CBC with differential: Within normal limits, hemoglobin normal CMP: Unremarkable  UA: Gross hematuria, not able to interpret due to gross hematuria Sending urine culture   Imaging:  Bladder scan 300    Problem List / ED Course / Critical interventions / Medication management  Patient reporting to emergency room today with gross hematuria and dysuria for one day.  Patient reports passing large clots in urine.  Attempted CBI, patient not tolerating and unable to get catheter properly placed.  CBI is not functioning properly this several attempt, pain is getting worse, contact urology for further recommendations on getting Foley placement versus treatment options. I ordered medication including morphine, zofran  for pain  Reevaluation of the patient after these medicines showed that the patient worsened.  Patients vitals assessed. Upon arrival patient is hemodynamically stable.  I have reviewed the patients home medicines and have made adjustments as needed   Consult: Urology.  Urology recommends trying 7 [place].  If this attempt is unsuccessful urology agrees to come down see the patient, recommending urology cart outside patient's room. Relayed info to nursing staff.   Plan:  Passed off patient to PA at shift change Trying 36 [place] Urology to come see patient if unsuccessful            Final Clinical Impression(s) / ED Diagnoses Final diagnoses:  None    Rx / DC Orders ED Discharge Orders     None         Reinaldo Raddle 01/30/23 2012    Tegeler, Canary Brim, MD 01/30/23 2246

## 2023-01-30 NOTE — ED Notes (Signed)
Verbal order for CBI.

## 2023-01-30 NOTE — ED Triage Notes (Signed)
Pt complaining of hematuria, and burning while urinating. Pt also endorses blood clots present. Pt does take Plavix and baby aspirin daily.

## 2023-01-30 NOTE — H&P (Signed)
Urology Consult   Physician requesting consult: Dr. Cristal Deer Tegeler  Reason for consult: Hematuria and clot retention  History of Present Illness: Mark Hansen is a 77 y.o. with a history of BPH status post TURP by Dr. Michelene Heady in [place] in 2015.  He has not seen Dr. Dawna Part in many years and is not on any medical therapy for BPH.  His voiding symptoms have been relatively manageable.  He does have nocturia 4-5 times per night although this has been chronic.  He did undergo an aortic graft and bilateral common iliac stents in February.  He has been on Plavix and aspirin since.  He noted the new onset of significant gross hematuria this morning and passed a large clot.  He subsequently began having difficulty urinating and has been in urinary retention since mid-to-late morning.  He is quite uncomfortable currently.  He is a smoker.  Attempts to place a Foley catheter by the nursing staff was unsuccessful with resistance met proximally.   Past Medical History:  Diagnosis Date   Anxiety    Back pain    GERD (gastroesophageal reflux disease)    Hip fracture (HCC) 02/05/2017   left   Peripheral vascular disease (HCC)    Prostate disease    Thrombocytopenia (HCC) 02/05/2017    Past Surgical History:  Procedure Laterality Date   ABDOMINAL AORTIC ENDOVASCULAR STENT GRAFT N/A 06/01/2022   Procedure: ABDOMINAL AORTIC ENDOVASCULAR STENT GRAFT;  Surgeon: Victorino Sparrow, MD;  Location: [facility] OR;  Service: Vascular;  Laterality: N/A;   ANKLE SURGERY     bilateral ankle surgery   COLONOSCOPY  01/07/2011   EYE SURGERY     bilateral cataract removal   FEMUR IM NAIL Left 02/05/2017   Procedure: INTRAMEDULLARY (IM) NAIL FEMORAL LEFT;  Surgeon: Yolonda Kida, MD;  Location: [facility] ORS;  Service: Orthopedics;  Laterality: Left;   OTHER SURGICAL HISTORY     unspecified type of prostate surgery    Medications:  Home meds:  No current facility-administered medications on  file prior to encounter.   Current Outpatient Medications on File Prior to Encounter  Medication Sig Dispense Refill   aspirin EC 81 MG tablet Take 1 tablet (81 mg total) by mouth daily. Swallow whole. (Patient not taking: Reported on 01/08/2023) 30 tablet 12   clopidogrel (PLAVIX) 75 MG tablet Take 1 tablet (75 mg total) by mouth daily. (Patient not taking: Reported on 01/08/2023) 30 tablet 11   Fluticasone-Umeclidin-Vilant (TRELEGY ELLIPTA) 100-62.5-25 MCG/ACT AEPB Inhale 1 puff into the lungs daily. (Patient not taking: Reported on 01/08/2023) 60 each 3   ibuprofen (ADVIL,MOTRIN) 400 MG tablet Take 1 tablet (400 mg total) by mouth every 8 (eight) hours as needed for moderate pain. (Patient not taking: Reported on 05/21/2022)     losartan (COZAAR) 50 MG tablet Take 1 tablet (50 mg total) by mouth every morning. (Patient not taking: Reported on 05/21/2022) 30 tablet 0   NIFEdipine (PROCARDIA-XL/NIFEDICAL-XL) 30 MG 24 hr tablet Take 1 tablet (30 mg total) by mouth daily. (Patient not taking: Reported on 05/21/2022) 90 tablet 0   oxyCODONE-acetaminophen (PERCOCET) 5-325 MG tablet Take 1 tablet by mouth every 6 (six) hours as needed for severe pain. (Patient not taking: Reported on 07/03/2022) 8 tablet 0   rosuvastatin (CRESTOR) 20 MG tablet Take 1 tablet (20 mg total) by mouth at bedtime. 90 tablet 0     Scheduled Meds: Continuous Infusions:  sodium chloride irrigation     PRN Meds:.  Allergies: No Known Allergies  Family History  Problem Relation Age of Onset   CAD Mother 103   Asthma Mother    Asthma Father    Alzheimer's disease Father    Emphysema Father    CAD Brother 22       requiring bypass   Kidney disease Other        cousin    Social History:  reports that he quit smoking about 14 years ago. His smoking use included cigarettes. He started smoking about 49 years ago. He has a 35 pack-year smoking history. He has never used smokeless tobacco. He reports that he does not currently  use alcohol. He reports that he does not use drugs.  ROS: A complete review of systems was performed.  All systems are negative except for pertinent findings as noted.  Physical Exam:  Vital signs in last 24 hours: Temp:  [97.3 F (36.3 C)-97.8 F (36.6 C)] 97.3 F (36.3 C) (10/05 2002) Pulse Rate:  [91-96] 91 (10/05 2002) Resp:  [17-18] 18 (10/05 2002) BP: (155-181)/(81-87) 155/81 (10/05 2002) SpO2:  [99 %-100 %] 99 % (10/05 2002) Weight:  [51.7 kg] 51.7 kg (10/05 1636) Constitutional:  Alert and oriented, No acute distress Cardiovascular: Regular rate and rhythm, No JVD Respiratory: Normal respiratory effort GI: He has significant lower abdominal tenderness and distention consistent with a full bladder. Genitourinary: No CVAT. Normal male phallus, testes are descended bilaterally and non-tender and without masses, scrotum is normal in appearance without lesions or masses, perineum is normal on inspection. Lymphatic: No lymphadenopathy Neurologic: Grossly intact, no focal deficits Psychiatric: Normal mood and affect  Laboratory Data:  Recent Labs    01/30/23 1728  WBC 5.6  HGB 16.5  HCT 48.7  PLT 166    Recent Labs    01/30/23 1728  NA 140  K 4.0  CL 101  GLUCOSE 120*  BUN 17  CALCIUM 9.5  CREATININE 0.93     Results for orders placed or performed during the hospital encounter of 01/30/23 (from the past 24 hour(s))  Urinalysis, Routine w reflex microscopic -Urine, Clean Catch     Status: Abnormal   Collection Time: 01/30/23  5:13 PM  Result Value Ref Range   Color, Urine RED (A) YELLOW   APPearance TURBID (A) CLEAR   Specific Gravity, Urine  1.005 - 1.030    TEST NOT REPORTED DUE TO COLOR INTERFERENCE OF URINE PIGMENT   pH  5.0 - 8.0    TEST NOT REPORTED DUE TO COLOR INTERFERENCE OF URINE PIGMENT   Glucose, UA (A) NEGATIVE mg/dL    TEST NOT REPORTED DUE TO COLOR INTERFERENCE OF URINE PIGMENT   Hgb urine dipstick (A) NEGATIVE    TEST NOT REPORTED DUE TO  COLOR INTERFERENCE OF URINE PIGMENT   Bilirubin Urine (A) NEGATIVE    TEST NOT REPORTED DUE TO COLOR INTERFERENCE OF URINE PIGMENT   Ketones, ur (A) NEGATIVE mg/dL    TEST NOT REPORTED DUE TO COLOR INTERFERENCE OF URINE PIGMENT   Protein, ur (A) NEGATIVE mg/dL    TEST NOT REPORTED DUE TO COLOR INTERFERENCE OF URINE PIGMENT   Nitrite (A) NEGATIVE    TEST NOT REPORTED DUE TO COLOR INTERFERENCE OF URINE PIGMENT   Leukocytes,Ua (A) NEGATIVE    TEST NOT REPORTED DUE TO COLOR INTERFERENCE OF URINE PIGMENT   RBC / HPF >50 0 - 5 RBC/hpf   WBC, UA 0-5 0 - 5 WBC/hpf   Bacteria, UA NONE SEEN NONE SEEN  Squamous Epithelial / HPF 0-5 0 - 5 /HPF  Basic metabolic panel     Status: Abnormal   Collection Time: 01/30/23  5:28 PM  Result Value Ref Range   Sodium 140 135 - 145 mmol/L   Potassium 4.0 3.5 - 5.1 mmol/L   Chloride 101 98 - 111 mmol/L   CO2 29 22 - 32 mmol/L   Glucose, Bld 120 (H) 70 - 99 mg/dL   BUN 17 8 - 23 mg/dL   Creatinine, Ser 1.30 0.61 - 1.24 mg/dL   Calcium 9.5 8.9 - 86.5 mg/dL   GFR, Estimated >78 >46 mL/min   Anion gap 10 5 - 15  CBC     Status: None   Collection Time: 01/30/23  5:28 PM  Result Value Ref Range   WBC 5.6 4.0 - 10.5 K/uL   RBC 5.56 4.22 - 5.81 MIL/uL   Hemoglobin 16.5 13.0 - 17.0 g/dL   HCT 96.2 95.2 - 84.1 %   MCV 87.6 80.0 - 100.0 fL   MCH 29.7 26.0 - 34.0 pg   MCHC 33.9 30.0 - 36.0 g/dL   RDW 32.4 40.1 - 02.7 %   Platelets 166 150 - 400 K/uL   nRBC 0.0 0.0 - 0.2 %   No results found for this or any previous visit (from the past 240 hour(s)).  Renal Function: Recent Labs    01/30/23 1728  CREATININE 0.93   Estimated Creatinine Clearance: 48.6 mL/min (by C-G formula based on SCr of 0.93 mg/dL).  Procedure: Under sterile conditions, I attempted to place a 20 [place] three-way hematuria catheter.  This was unsuccessful with resistance met at the level of the prostatic urethra/bladder neck.  I then attempted placement of an 32 [place] coud catheter  and there was mild resistance at the bladder neck but this ultimately did pass into the bladder with return of grossly dark, bloody urine.  Approximately 400 to 500 cc of bloody urine was returned.  I then irrigated multiple clots from the bladder.  His urine was initially light pink.  However, after additional time, it became cherry red.  No additional clots were noted.  Impression/Recommendation 1.  Hematuria with clot urinary retention: He does live alone and his catheter is at risk of potentially clotting off.  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  As such, I will monitor him overnight and keep him adequately hydrated.  I will reevaluate him in the morning.  His catheter will be hand irrigated as needed.  He will stop his Plavix and aspirin.  If his urine remains quite bloody in the morning, I may consider taking him to the operating room for cystoscopy with balloon dilation of his bladder neck contracture so a proper hematuria catheter can be placed and continuous bladder irrigation can be started.  Crecencio Mc 01/30/2023, 9:07 PM    Moody Bruins MD  CC: Dr. Cristal Deer Tegeler

## 2023-01-30 NOTE — ED Notes (Signed)
ED TO INPATIENT HANDOFF REPORT  Name/Age/Gender Mark Hansen 77 y.o. male  Code Status    Code Status Orders  (From admission, onward)           Start     Ordered   01/30/23 2114  Full code  Continuous       Question:  By:  Answer:  Consent: discussion documented in EHR   01/30/23 2116           Code Status History     Date Active Date Inactive Code Status Order ID Comments User Context   06/01/2022 1106 06/02/2022 1432 Full Code [id number]  Danelle Berry Inpatient   02/05/2017 2100 02/08/2017 1803 Full Code [id number]  Yolonda Kida, MD Inpatient   02/05/2017 2100 02/05/2017 2100 Full Code [id number]  Zigmund Daniel., MD Inpatient   06/19/2012 2053 06/21/2012 1544 Full Code [id number]  Priscella Mann, DO Inpatient       Home/SNF/Other Home  Chief Complaint Hematuria [R31.9]  Level of Care/Admitting Diagnosis ED Disposition     ED Disposition  Admit   Condition  --   Comment  Hospital Area: [facility] [[id number]]  Level of Care: Med-Surg [16]  May place patient in observation at [facility] or [facility] if equivalent level of care is available:: No  Covid Evaluation: Asymptomatic - no recent exposure (last 10 days) testing not required  Diagnosis: Hematuria [366440]  Admitting Physician: Heloise Purpura [[id number]]  Attending Physician: Heloise Purpura [[id number]]          Medical History Past Medical History:  Diagnosis Date   Anxiety    Back pain    GERD (gastroesophageal reflux disease)    Hip fracture (HCC) 02/05/2017   left   Peripheral vascular disease (HCC)    Prostate disease    Thrombocytopenia (HCC) 02/05/2017    Allergies No Known Allergies  IV Location/Drains/Wounds Patient Lines/Drains/Airways Status     Active Line/Drains/Airways     Name Placement date Placement time Site Days   Peripheral IV 01/30/23 20 G Left Antecubital 01/30/23  1811  Antecubital  less than 1             Labs/Imaging Results for orders placed or performed during the hospital encounter of 01/30/23 (from the past 48 hour(s))  Urinalysis, Routine w reflex microscopic -Urine, Clean Catch     Status: Abnormal   Collection Time: 01/30/23  5:13 PM  Result Value Ref Range   Color, Urine RED (A) YELLOW    Comment: BIOCHEMICALS MAY BE AFFECTED BY COLOR   APPearance TURBID (A) CLEAR   Specific Gravity, Urine  1.005 - 1.030    TEST NOT REPORTED DUE TO COLOR INTERFERENCE OF URINE PIGMENT   pH  5.0 - 8.0    TEST NOT REPORTED DUE TO COLOR INTERFERENCE OF URINE PIGMENT   Glucose, UA (A) NEGATIVE mg/dL    TEST NOT REPORTED DUE TO COLOR INTERFERENCE OF URINE PIGMENT   Hgb urine dipstick (A) NEGATIVE    TEST NOT REPORTED DUE TO COLOR INTERFERENCE OF URINE PIGMENT   Bilirubin Urine (A) NEGATIVE    TEST NOT REPORTED DUE TO COLOR INTERFERENCE OF URINE PIGMENT   Ketones, ur (A) NEGATIVE mg/dL    TEST NOT REPORTED DUE TO COLOR INTERFERENCE OF URINE PIGMENT   Protein, ur (A) NEGATIVE mg/dL    TEST NOT REPORTED DUE TO COLOR INTERFERENCE OF URINE PIGMENT   Nitrite (A) NEGATIVE    TEST NOT REPORTED  DUE TO COLOR INTERFERENCE OF URINE PIGMENT   Leukocytes,Ua (A) NEGATIVE    TEST NOT REPORTED DUE TO COLOR INTERFERENCE OF URINE PIGMENT   RBC / HPF >50 0 - 5 RBC/hpf    Comment: CORRECTED ON 10/05 AT 1754: PREVIOUSLY REPORTED AS 0 5   WBC, UA 0-5 0 - 5 WBC/hpf   Bacteria, UA NONE SEEN NONE SEEN   Squamous Epithelial / HPF 0-5 0 - 5 /HPF    Comment: Performed at [facility], [address] [place], [place] [address]  Basic metabolic panel     Status: Abnormal   Collection Time: 01/30/23  5:28 PM  Result Value Ref Range   Sodium 140 135 - 145 mmol/L   Potassium 4.0 3.5 - 5.1 mmol/L   Chloride 101 98 - 111 mmol/L   CO2 29 22 - 32 mmol/L   Glucose, Bld 120 (H) 70 - 99 mg/dL    Comment: Glucose reference range applies only to samples taken after fasting for at least 8 hours.   BUN 17 8 -  23 mg/dL   Creatinine, Ser 6.04 0.61 - 1.24 mg/dL   Calcium 9.5 8.9 - 54.0 mg/dL   GFR, Estimated >98 >11 mL/min    Comment: (NOTE) Calculated using the CKD-EPI Creatinine Equation (2021)    Anion gap 10 5 - 15    Comment: Performed at [facility], [address], [place], [place] [address]  CBC     Status: None   Collection Time: 01/30/23  5:28 PM  Result Value Ref Range   WBC 5.6 4.0 - 10.5 K/uL   RBC 5.56 4.22 - 5.81 MIL/uL   Hemoglobin 16.5 13.0 - 17.0 g/dL   HCT 29.5 62.1 - 30.8 %   MCV 87.6 80.0 - 100.0 fL   MCH 29.7 26.0 - 34.0 pg   MCHC 33.9 30.0 - 36.0 g/dL   RDW 65.7 84.6 - 96.2 %   Platelets 166 150 - 400 K/uL   nRBC 0.0 0.0 - 0.2 %    Comment: Performed at [facility], [address] [place], [place] [address]   No results found.  Pending Labs Unresulted Labs (From admission, onward)     Start     Ordered   01/30/23 1810  Urine Culture  Add-on,   AD       Question:  Indication  Answer:  Acute gross hematuria   01/30/23 1810   Signed and Held  Hemoglobin and hematocrit, blood  Tomorrow morning,   R        Signed and Held            Vitals/Pain Today's Vitals   01/30/23 1618 01/30/23 1635 01/30/23 1636 01/30/23 2002  BP: (!) 181/87   (!) 155/81  Pulse: 96   91  Resp: 17   18  Temp: 97.8 F (36.6 C)   (!) 97.3 F (36.3 C)  TempSrc: Oral   Oral  SpO2: 100%   99%  Weight:   51.7 kg   Height:   5\' 3"  (1.6 m)   PainSc:  4       Isolation Precautions No active isolations  Medications Medications  sodium chloride irrigation 0.9 % 3,000 mL (3,000 mLs Irrigation Not Given 01/30/23 2136)  morphine (PF) 4 MG/ML injection 4 mg (4 mg Intravenous Given 01/30/23 1841)  ondansetron (ZOFRAN) injection 4 mg (4 mg Intravenous Given 01/30/23 1841)  morphine (PF) 2 MG/ML injection 2 mg (2  mg Intravenous Given 01/30/23 2033)    Mobility walks

## 2023-01-31 DIAGNOSIS — I739 Peripheral vascular disease, unspecified: Secondary | ICD-10-CM | POA: Diagnosis not present

## 2023-01-31 DIAGNOSIS — R310 Gross hematuria: Secondary | ICD-10-CM | POA: Diagnosis not present

## 2023-01-31 DIAGNOSIS — Z7901 Long term (current) use of anticoagulants: Secondary | ICD-10-CM | POA: Diagnosis not present

## 2023-01-31 LAB — URINE CULTURE: Culture: <10000 — AB

## 2023-01-31 LAB — HEMOGLOBIN AND HEMATOCRIT, BLOOD
HCT: 41.5 % (ref 39.0–52.0)
Hemoglobin: 13.7 g/dL (ref 13.0–17.0)

## 2023-01-31 NOTE — Progress Notes (Signed)
Transition of Care [facility]) - Inpatient Brief Assessment   Patient Details  Name: Mark Hansen MRN: [id number] Date of Birth: 1945/08/04  [facility] CM/SW Contact:    Adrian Prows, RN Phone Number: 01/31/2023, 9:26 AM   Clinical Narrative: Pt identified POC [name] (son) [phone]; TOC brief assessment completed.   Transition of Care Asessment: Insurance and Status: Insurance coverage has been reviewed Patient has primary care physician: Yes Home environment has been reviewed: yes Prior level of function:: independent Prior/Current Home Services: No current home services Social Determinants of Health Reivew: SDOH reviewed no interventions necessary Readmission risk has been reviewed: Yes Transition of care needs: no transition of care needs at this time

## 2023-01-31 NOTE — Progress Notes (Signed)
Patient ID: Mark Hansen, male   DOB: Sep 13, 1945, 77 y.o.   MRN: [id number]    Subjective: Pt doing well overnight.  Did not require irrigation.  Objective: Vital signs in last 24 hours: Temp:  [97.3 F (36.3 C)-98.4 F (36.9 C)] 98.1 F (36.7 C) (10/06 0600) Pulse Rate:  [68-96] 75 (10/06 0600) Resp:  [17-20] 20 (10/06 0600) BP: (110-181)/(63-87) 125/70 (10/06 0600) SpO2:  [98 %-100 %] 100 % (10/06 0600) Weight:  [51.7 kg] 51.7 kg (10/05 1636)  Intake/Output from previous day: 10/05 0701 - 10/06 0700 In: 572.7 [I.V.:572.7] Out: 1050 [Urine:1050] Intake/Output this shift: No intake/output data recorded.  Physical Exam:  General: Alert and oriented GU: Urine mostly pink in tubing.  Irrigated with no clots noted.  Lab Results: Recent Labs    01/30/23 1728 01/31/23 0537  HGB 16.5 13.7  HCT 48.7 41.5   BMET Recent Labs    01/30/23 1728  NA 140  K 4.0  CL 101  CO2 29  GLUCOSE 120*  BUN 17  CREATININE 0.93  CALCIUM 9.5     Studies/Results: No results found.  Assessment/Plan: Hematuria: Improving overnight.  Continue Foley and will discharge home.  Will arrange outpatient follow up for hematuria evaluation with imaging and cystoscopy.   LOS: 0 days   Mark Hansen 01/31/2023, 7:18 AM

## 2023-01-31 NOTE — Discharge Instructions (Addendum)
Call if catheter is not draining well.

## 2023-01-31 NOTE — Discharge Summary (Signed)
  Date of admission: 01/30/2023  Date of discharge: 01/31/2023  Admission diagnosis: Hematuria  Discharge diagnosis: Hematuria  Secondary diagnoses: Peripheral vascular disease  History and Physical: For full details, please see admission history and physical. Briefly, Kaylin Schellenberg is a 77 y.o. year old patient with new onset gross hematuria.   Hospital Course: He presented to the [facility] ED with clot urinary retention and new gross hematuria.  Attempts to place a 22 Fr hematuria catheter were unsuccessful.  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  An 18 Fr catheter was able to be placed and he was irrigated until no more clots were noted.  His urine was still a bit bloody and so he was observed overnight.  The next morning, his urine was light pink and no clots were able to be irrigated.  He was stable for discharge home.  Laboratory values:  Recent Labs    01/30/23 1728 01/31/23 0537  HGB 16.5 13.7  HCT 48.7 41.5   Recent Labs    01/30/23 1728  CREATININE 0.93    Disposition: Home  Discharge instruction: You may shower and can resume activity as tolerated.  Discharge medications:   Resume all regular medications EXCEPT STOP ASPIRIN AND PLAVIX for now.   Followup:   Follow-up Information     [facility] Follow up.   Why: Will call to schedule Contact information: [address] [place] [address] [phone]

## 2023-01-31 NOTE — Progress Notes (Signed)
Continues to have blood tinged urine. Expelled one long clot. Urine has mildly gotten lighter. Pt reports improvement of pain since urinary catheter was inserted. Reports just minor discomfort. Will continue to monitor.

## 2023-02-03 ENCOUNTER — Ambulatory Visit: Payer: Medicare HMO | Admitting: Pulmonary Disease

## 2023-02-03 ENCOUNTER — Encounter: Payer: Self-pay | Admitting: Pulmonary Disease

## 2023-02-03 VITALS — BP 139/82 | HR 94 | Temp 97.7°F | Ht 63.0 in | Wt 115.0 lb

## 2023-02-03 DIAGNOSIS — I739 Peripheral vascular disease, unspecified: Secondary | ICD-10-CM

## 2023-02-03 DIAGNOSIS — Q630 Accessory kidney: Secondary | ICD-10-CM | POA: Diagnosis not present

## 2023-02-03 DIAGNOSIS — N281 Cyst of kidney, acquired: Secondary | ICD-10-CM | POA: Diagnosis not present

## 2023-02-03 DIAGNOSIS — R932 Abnormal findings on diagnostic imaging of liver and biliary tract: Secondary | ICD-10-CM | POA: Diagnosis not present

## 2023-02-03 DIAGNOSIS — R310 Gross hematuria: Secondary | ICD-10-CM | POA: Diagnosis not present

## 2023-02-03 NOTE — Progress Notes (Signed)
@Patient  ID: Mark Hansen, male    DOB: Nov 09, 1945, 77 y.o.   MRN: [id number]  Chief Complaint  Patient presents with   Follow-up    Breathing has improved back to baseline after he had abdominal surgery Feb 2024.     Referring provider: Renaye Rakers, MD  HPI:   77 y.o. man whom are in follow-up for evaluation of dyspnea on exertion.  Note from referring provider reviewed.   Most recent vascular surgery note reviewed.  Discharge home after stenting of the bilateral legs 05/2022 reviewed.  Discharge summary 01/2023 with hematuria and urology evaluation reviewed.  He is due well.  No dyspnea.  Not using Trelegy.  No inhalers.  After stenting of his bilateral legs his issues in terms of discomfort exercising or walking has markedly improved to resolved.  HPI at initial visit: Patient was usual state of health.  About 3 to 4 months ago noted some dyspnea on exertion.  Out of the blue.  No triggering event that he can identify.  No time of day when things are better or worse.  No position makes it better or worse.  No seasonal environmental factors he can identify that may have triggered makes things better or worse.  He was given Trelegy.  He used the samples.  He felt it helped his breathing, feel more energy etc.  He has since run out of this.  No other relieving or exacerbating factors.  Review of chest imaging reveals CTA PE protocol 05/2012 that reveals emphysema but otherwise clear lungs on my review interpretation.  Chest x-ray 01/2017 appears hyperinflated with clear lungs bilaterally on my review and interpretation.  Chest x-ray 11/2021 appears hyperinflated with clear lungs bilaterally on my review and interpretation.  PMH: Hyperlipidemia, tobacco abuse in remission, peripheral vascular disease Surgical history: Ankle surgery, leg fracture surgery Family history: Mother with CAD, asthma, probable asthma Social history: Former smoker, 35-pack-year, quit 2010, lives in  [place] / Pulmonary Flowsheets:   ACT:      No data to display          MMRC:     No data to display          Epworth:      No data to display          Tests:   FENO:  No results found for: "NITRICOXIDE"  PFT:    Latest Ref Rng & Units 03/05/2022   10:39 AM  PFT Results  FVC-Pre L 3.32   FVC-Predicted Pre % 107   FVC-Post L 3.20   FVC-Predicted Post % 104   Pre FEV1/FVC % % 74   Post FEV1/FCV % % 75   FEV1-Pre L 2.44   FEV1-Predicted Pre % 112   FEV1-Post L 2.41   DLCO uncorrected ml/min/mmHg 15.07   DLCO UNC% % 75   DLCO corrected ml/min/mmHg 15.07   DLCO COR %Predicted % 75   DLVA Predicted % 71   TLC L 5.31   TLC % Predicted % 94   RV % Predicted % 84   Personally reviewed interpreted as normal spirometry, no bronchodilator spots, lung volumes within normal notes, DLCO within normal limits  WALK:      No data to display          Imaging: Personally reviewed and as per EMR discussion this note VAS [place] ABI WITH/WO TBI  Result Date: 01/08/2023  LOWER EXTREMITY DOPPLER STUDY Patient Name:  Mark Hansen  Date of Exam:  01/08/2023 Medical Rec #: [id number]       Accession #:    [id number] Date of Birth: [birth year]       Patient Gender: M Patient Age:   83 years Exam Location:  [facility] Procedure:      VAS [place] ABI WITH/WO TBI Referring Phys: JOSHUA ROBINS --------------------------------------------------------------------------------  High Risk Factors: Past history of smoking.  Comparison Study: 07/03/2022 ABI/TBI- right= 1.06/0.48, left=1.05/0.56 Performing Technologist: Gertie Fey MHA, RVT, RDCS, RDMS  Examination Guidelines: A complete evaluation includes at minimum, Doppler waveform signals and systolic blood pressure reading at the level of bilateral brachial, anterior tibial, and posterior tibial arteries, when vessel segments are accessible. Bilateral testing is considered an integral part of a  complete examination. Photoelectric Plethysmograph (PPG) waveforms and toe systolic pressure readings are included as required and additional duplex testing as needed. Limited examinations for reoccurring indications may be performed as noted.  ABI Findings: +---------+------------------+-----+---------+--------+ Right    Rt Pressure (mmHg)IndexWaveform Comment  +---------+------------------+-----+---------+--------+ Brachial 164                                      +---------+------------------+-----+---------+--------+ PTA      171               1.01 triphasic         +---------+------------------+-----+---------+--------+ DP       192               1.13 triphasic         +---------+------------------+-----+---------+--------+ Great Toe                       Absent            +---------+------------------+-----+---------+--------+ +---------+------------------+-----+---------+-------+ Left     Lt Pressure (mmHg)IndexWaveform Comment +---------+------------------+-----+---------+-------+ Brachial 170                                     +---------+------------------+-----+---------+-------+ PTA      173               1.02 biphasic         +---------+------------------+-----+---------+-------+ DP       161               0.95 triphasic        +---------+------------------+-----+---------+-------+ Great Toe                       Absent           +---------+------------------+-----+---------+-------+ +-------+-----------+-----------+------------+------------+ ABI/TBIToday's ABIToday's TBIPrevious ABIPrevious TBI +-------+-----------+-----------+------------+------------+ Right  1.13       0.00       1.06        0.48         +-------+-----------+-----------+------------+------------+ Left   1.02       0.00       1.05        0.56         +-------+-----------+-----------+------------+------------+  Bilateral ABIs appear essentially unchanged  compared to prior study on 07/03/2022. Bilateral TBIs appear decreased compared to prior study on 07/03/2022.  Summary: Right: Resting right ankle-brachial index is within normal range. The right toe-brachial index is abnormal. Left: Resting left ankle-brachial index is within normal range. The left toe-brachial index is abnormal. *See table(s) above for measurements  and observations.  Electronically signed by Gerarda Fraction on 01/08/2023 at 5:25:18 PM.    Final    VAS US AORTA/IVC/ILIACS  Result Date: 01/08/2023 ABDOMINAL AORTA STUDY Patient Name:  Mark Hansen  Date of Exam:   01/08/2023 Medical Rec #: [id number]       Accession #:    [id number] Date of Birth: 18-Dec-1945       Patient Gender: M Patient Age:   41 years Exam Location:  [facility] Procedure:      VAS US AORTA/IVC/ILIACS Referring Phys: Ivin Booty ROBINS --------------------------------------------------------------------------------  Indications: f/u aortic tube graft and bilateral iliac artery stenting Risk Factors: Past history of smoking. Vascular Interventions: 06/01/2022 -Aortic Tube graft - 20x4.5cm                         -Bilateral common iliac stents                         - Right 8x66mm                         - Left 8x73mm, 8x65mm. Limitations: Air/bowel gas.  Comparison Study: 07/03/2022 Aorta/iliac duplex- Abdominal Aorta: No evidence of                   an abdominal aortic aneurysm was                   visualized. Stenosis: Widely patent aortic tube graft and                   bilateral common iliac artery stents, without stenosis. Performing Technologist: Gertie Fey MHA, RDMS, RVT, RDCS  Examination Guidelines: A complete evaluation includes B-mode imaging, spectral Doppler, color Doppler, and power Doppler as needed of all accessible portions of each vessel. Bilateral testing is considered an integral part of a complete examination. Limited examinations for reoccurring indications may be performed as noted.   Abdominal Aorta Findings: +-------------+-------+----------+----------+--------+--------+----------+ Location     AP (cm)Trans (cm)PSV (cm/s)WaveformThrombusComments   +-------------+-------+----------+----------+--------+--------+----------+ Proximal                      40                                   +-------------+-------+----------+----------+--------+--------+----------+ Mid                           41                                   +-------------+-------+----------+----------+--------+--------+----------+ Distal                        47        biphasic        Tube graft +-------------+-------+----------+----------+--------+--------+----------+ RT EIA Prox                   133       biphasic                   +-------------+-------+----------+----------+--------+--------+----------+ RT EIA Mid  161       biphasic                   +-------------+-------+----------+----------+--------+--------+----------+ RT EIA Distal                 152       biphasic                   +-------------+-------+----------+----------+--------+--------+----------+ LT EIA Prox                   132       biphasic                   +-------------+-------+----------+----------+--------+--------+----------+ LT EIA Mid                    120       biphasic                   +-------------+-------+----------+----------+--------+--------+----------+ LT EIA Distal                 142       biphasic                   +-------------+-------+----------+----------+--------+--------+----------+ Right Stent(s): +---------------+--------+--------+--------+------------+ CIA            PSV cm/sStenosisWaveformComments     +---------------+--------+--------+--------+------------+ Prox to Stent  46              biphasicdistal aorta +---------------+--------+--------+--------+------------+ Proximal Stent 65               biphasic             +---------------+--------+--------+--------+------------+ Mid Stent      64              biphasic             +---------------+--------+--------+--------+------------+ Distal Stent   88              biphasic             +---------------+--------+--------+--------+------------+ Distal to Stent102             biphasicterminal CIA +---------------+--------+--------+--------+------------+   Left Stent(s): +---------------+--------+--------+--------+------------+ CIA            PSV cm/sStenosisWaveformComments     +---------------+--------+--------+--------+------------+ Prox to Stent  53              biphasic             +---------------+--------+--------+--------+------------+ Proximal Stent 48              biphasic             +---------------+--------+--------+--------+------------+ Mid Stent      86              biphasic             +---------------+--------+--------+--------+------------+ Distal Stent   70              biphasic             +---------------+--------+--------+--------+------------+ Distal to Stent121             biphasicterminal CIA +---------------+--------+--------+--------+------------+  Summary: Stenosis: +------------------+-----------+ Location          Stent       +------------------+-----------+ Distal Aorta      patent      +------------------+-----------+ Right Common Iliacno stenosis +------------------+-----------+ Left Common Iliac no stenosis +------------------+-----------+   *See table(s) above for measurements  and observations.  Electronically signed by Gerarda Fraction on 01/08/2023 at 5:24:58 PM.    Final     Lab Results: Personally reviewed CBC    Component Value Date/Time   WBC 5.6 01/30/2023 1728   RBC 5.56 01/30/2023 1728   HGB 13.7 01/31/2023 0537   HCT 41.5 01/31/2023 0537   PLT 166 01/30/2023 1728   MCV 87.6 01/30/2023 1728   MCH 29.7 01/30/2023 1728   MCHC 33.9 01/30/2023  1728   RDW 12.3 01/30/2023 1728   LYMPHSABS 1.3 06/19/2012 1352   MONOABS 0.3 06/19/2012 1352   EOSABS 0.1 06/19/2012 1352   BASOSABS 0.0 06/19/2012 1352    BMET    Component Value Date/Time   NA 140 01/30/2023 1728   NA 141 03/09/2022 0836   K 4.0 01/30/2023 1728   CL 101 01/30/2023 1728   CO2 29 01/30/2023 1728   GLUCOSE 120 (H) 01/30/2023 1728   BUN 17 01/30/2023 1728   BUN 22 03/09/2022 0836   CREATININE 0.93 01/30/2023 1728   CALCIUM 9.5 01/30/2023 1728   GFRNONAA >60 01/30/2023 1728   GFRAA >60 02/07/2017 0527    BNP No results found for: "BNP"  ProBNP No results found for: "PROBNP"  Specialty Problems   None   No Known Allergies  Immunization History  Administered Date(s) Administered   Pneumococcal-Unspecified 04/27/2010   Tdap 04/27/1964    Past Medical History:  Diagnosis Date   Anxiety    Back pain    GERD (gastroesophageal reflux disease)    Hip fracture (HCC) 02/05/2017   left   Peripheral vascular disease (HCC)    Prostate disease    Thrombocytopenia (HCC) 02/05/2017    Tobacco History: Social History   Tobacco Use  Smoking Status Former   Current packs/day: 0.00   Average packs/day: 1 pack/day for 35.0 years (35.0 ttl pk-yrs)   Types: Cigarettes   Start date: 62   Quit date: 2010   Years since quitting: 14.7  Smokeless Tobacco Never   Counseling given: Not Answered   Continue to not smoke  Outpatient Encounter Medications as of 02/03/2023  Medication Sig   losartan (COZAAR) 50 MG tablet Take 1 tablet (50 mg total) by mouth every morning. (Patient not taking: Reported on 05/21/2022)   NIFEdipine (PROCARDIA-XL/NIFEDICAL-XL) 30 MG 24 hr tablet Take 1 tablet (30 mg total) by mouth daily. (Patient not taking: Reported on 05/21/2022)   oxyCODONE-acetaminophen (PERCOCET) 5-325 MG tablet Take 1 tablet by mouth every 6 (six) hours as needed for severe pain. (Patient not taking: Reported on 07/03/2022)   rosuvastatin (CRESTOR) 20 MG  tablet Take 1 tablet (20 mg total) by mouth at bedtime.   [DISCONTINUED] Fluticasone-Umeclidin-Vilant (TRELEGY ELLIPTA) 100-62.5-25 MCG/ACT AEPB Inhale 1 puff into the lungs daily. (Patient not taking: Reported on 01/08/2023)   No facility-administered encounter medications on file as of 02/03/2023.     Review of Systems  Review of Systems  N/a Physical Exam  BP 139/82 (BP Location: Right Arm, Cuff Size: Normal)   Pulse 94   Temp 97.7 F (36.5 C) (Oral)   Ht 5\' 3"  (1.6 m)   Wt 115 lb (52.2 kg)   SpO2 98% Comment: on RA  BMI 20.37 kg/m   Wt Readings from Last 5 Encounters:  02/03/23 115 lb (52.2 kg)  01/30/23 114 lb (51.7 kg)  01/08/23 114 lb 9.6 oz (52 kg)  07/03/22 112 lb (50.8 kg)  06/01/22 115 lb (52.2 kg)    BMI Readings from Last 5  Encounters:  02/03/23 20.37 kg/m  01/30/23 20.19 kg/m  01/08/23 20.30 kg/m  07/03/22 19.84 kg/m  06/01/22 20.37 kg/m     Physical Exam General: Sitting in exam chair, no acute distress Eyes: EOMI, no icterus Neck: Supple, no JVP Pulmonary: Clear, distant Cardiovascular: Warm, no edema Abdomen: Nondistended, bowel sounds present MSK: No synovitis, no joint effusion Neuro: Normal gait, no weakness Psych: Normal mood, full affect   Assessment & Plan:   Dyspnea on exertion: Overall improved.  Trelegy helped.  Also sustained improvement off Trelegy at times.  Has been off Trelegy for some time and doing well.  PFTs normal.  Subjective improvement after bilateral iliac stenting.  No need for inhalers, removed from his medication list.  Leg pain: Bilaterally, in the ankles and legs higher up.  Sounds like claudication.  Resolved with bilateral iliac stenting.   Return if symptoms worsen or fail to improve, for f/u Dr. Judeth Horn.   Karren Burly, MD 02/03/2023

## 2023-02-03 NOTE — Patient Instructions (Signed)
I am glad you are doing well  No inhalers - this is good! Stay off inhaler  Return to clinic as needed - if having trouble please let me know and we will see you again!

## 2023-02-19 DIAGNOSIS — N3000 Acute cystitis without hematuria: Secondary | ICD-10-CM | POA: Diagnosis not present

## 2023-02-19 DIAGNOSIS — R8271 Bacteriuria: Secondary | ICD-10-CM | POA: Diagnosis not present

## 2023-03-29 DIAGNOSIS — E782 Mixed hyperlipidemia: Secondary | ICD-10-CM | POA: Diagnosis not present

## 2023-03-29 DIAGNOSIS — R7303 Prediabetes: Secondary | ICD-10-CM | POA: Diagnosis not present

## 2023-03-29 DIAGNOSIS — I10 Essential (primary) hypertension: Secondary | ICD-10-CM | POA: Diagnosis not present

## 2023-03-29 DIAGNOSIS — I739 Peripheral vascular disease, unspecified: Secondary | ICD-10-CM | POA: Diagnosis not present

## 2023-04-14 DIAGNOSIS — Z0001 Encounter for general adult medical examination with abnormal findings: Secondary | ICD-10-CM | POA: Diagnosis not present

## 2023-04-14 DIAGNOSIS — N3000 Acute cystitis without hematuria: Secondary | ICD-10-CM | POA: Diagnosis not present

## 2023-04-15 ENCOUNTER — Other Ambulatory Visit: Payer: Self-pay | Admitting: Vascular Surgery

## 2023-08-04 DIAGNOSIS — H53411 Scotoma involving central area, right eye: Secondary | ICD-10-CM | POA: Diagnosis not present

## 2023-08-04 DIAGNOSIS — H5203 Hypermetropia, bilateral: Secondary | ICD-10-CM | POA: Diagnosis not present

## 2023-08-04 DIAGNOSIS — H3540 Unspecified peripheral retinal degeneration: Secondary | ICD-10-CM | POA: Diagnosis not present

## 2023-08-04 DIAGNOSIS — H52223 Regular astigmatism, bilateral: Secondary | ICD-10-CM | POA: Diagnosis not present

## 2023-08-04 DIAGNOSIS — H353132 Nonexudative age-related macular degeneration, bilateral, intermediate dry stage: Secondary | ICD-10-CM | POA: Diagnosis not present

## 2023-08-04 DIAGNOSIS — H26493 Other secondary cataract, bilateral: Secondary | ICD-10-CM | POA: Diagnosis not present

## 2023-08-04 DIAGNOSIS — H524 Presbyopia: Secondary | ICD-10-CM | POA: Diagnosis not present

## 2023-08-19 DIAGNOSIS — N3000 Acute cystitis without hematuria: Secondary | ICD-10-CM | POA: Diagnosis not present

## 2023-08-19 DIAGNOSIS — R351 Nocturia: Secondary | ICD-10-CM | POA: Diagnosis not present

## 2023-08-19 DIAGNOSIS — N401 Enlarged prostate with lower urinary tract symptoms: Secondary | ICD-10-CM | POA: Diagnosis not present

## 2023-08-30 DIAGNOSIS — N3000 Acute cystitis without hematuria: Secondary | ICD-10-CM | POA: Diagnosis not present

## 2023-08-30 DIAGNOSIS — R350 Frequency of micturition: Secondary | ICD-10-CM | POA: Diagnosis not present

## 2023-08-30 DIAGNOSIS — N401 Enlarged prostate with lower urinary tract symptoms: Secondary | ICD-10-CM | POA: Diagnosis not present

## 2023-09-08 DIAGNOSIS — H53413 Scotoma involving central area, bilateral: Secondary | ICD-10-CM | POA: Diagnosis not present

## 2023-09-08 DIAGNOSIS — H353132 Nonexudative age-related macular degeneration, bilateral, intermediate dry stage: Secondary | ICD-10-CM | POA: Diagnosis not present

## 2023-09-10 DIAGNOSIS — N410 Acute prostatitis: Secondary | ICD-10-CM | POA: Diagnosis not present

## 2023-09-17 DIAGNOSIS — I7300 Raynaud's syndrome without gangrene: Secondary | ICD-10-CM | POA: Diagnosis not present

## 2023-09-17 DIAGNOSIS — I10 Essential (primary) hypertension: Secondary | ICD-10-CM | POA: Diagnosis not present

## 2023-09-17 DIAGNOSIS — E559 Vitamin D deficiency, unspecified: Secondary | ICD-10-CM | POA: Diagnosis not present

## 2023-09-17 DIAGNOSIS — N400 Enlarged prostate without lower urinary tract symptoms: Secondary | ICD-10-CM | POA: Diagnosis not present

## 2023-09-17 DIAGNOSIS — R7303 Prediabetes: Secondary | ICD-10-CM | POA: Diagnosis not present

## 2023-09-17 DIAGNOSIS — I739 Peripheral vascular disease, unspecified: Secondary | ICD-10-CM | POA: Diagnosis not present

## 2023-09-17 DIAGNOSIS — L989 Disorder of the skin and subcutaneous tissue, unspecified: Secondary | ICD-10-CM | POA: Diagnosis not present

## 2023-09-17 DIAGNOSIS — E7800 Pure hypercholesterolemia, unspecified: Secondary | ICD-10-CM | POA: Diagnosis not present

## 2023-09-17 DIAGNOSIS — L110 Acquired keratosis follicularis: Secondary | ICD-10-CM | POA: Diagnosis not present

## 2023-09-17 DIAGNOSIS — Z6821 Body mass index (BMI) 21.0-21.9, adult: Secondary | ICD-10-CM | POA: Diagnosis not present

## 2023-10-15 DIAGNOSIS — I10 Essential (primary) hypertension: Secondary | ICD-10-CM | POA: Diagnosis not present

## 2023-10-15 DIAGNOSIS — B070 Plantar wart: Secondary | ICD-10-CM | POA: Diagnosis not present

## 2024-01-10 ENCOUNTER — Other Ambulatory Visit: Payer: Self-pay | Admitting: Vascular Surgery

## 2024-01-10 DIAGNOSIS — I70213 Atherosclerosis of native arteries of extremities with intermittent claudication, bilateral legs: Secondary | ICD-10-CM

## 2024-01-26 DIAGNOSIS — Z23 Encounter for immunization: Secondary | ICD-10-CM | POA: Diagnosis not present

## 2024-01-26 DIAGNOSIS — M545 Low back pain, unspecified: Secondary | ICD-10-CM | POA: Diagnosis not present

## 2024-01-26 DIAGNOSIS — I119 Hypertensive heart disease without heart failure: Secondary | ICD-10-CM | POA: Diagnosis not present

## 2024-01-26 DIAGNOSIS — I739 Peripheral vascular disease, unspecified: Secondary | ICD-10-CM | POA: Diagnosis not present

## 2024-02-12 ENCOUNTER — Emergency Department (HOSPITAL_COMMUNITY)

## 2024-02-12 ENCOUNTER — Emergency Department (HOSPITAL_COMMUNITY)
Admission: EM | Admit: 2024-02-12 | Discharge: 2024-02-12 | Disposition: A | Discharge status: Home / Self Care | Attending: Emergency Medicine | Admitting: Emergency Medicine

## 2024-02-12 DIAGNOSIS — Z79899 Other long term (current) drug therapy: Secondary | ICD-10-CM | POA: Insufficient documentation

## 2024-02-12 DIAGNOSIS — R1032 Left lower quadrant pain: Secondary | ICD-10-CM

## 2024-02-12 DIAGNOSIS — R319 Hematuria, unspecified: Secondary | ICD-10-CM | POA: Diagnosis not present

## 2024-02-12 DIAGNOSIS — R10A2 Flank pain, left side: Secondary | ICD-10-CM | POA: Diagnosis not present

## 2024-02-12 DIAGNOSIS — Z7982 Long term (current) use of aspirin: Secondary | ICD-10-CM | POA: Insufficient documentation

## 2024-02-12 DIAGNOSIS — N210 Calculus in bladder: Secondary | ICD-10-CM | POA: Diagnosis not present

## 2024-02-12 LAB — CBC
HCT: 47.5 % (ref 39.0–52.0)
Hemoglobin: 15.7 g/dL (ref 13.0–17.0)
MCH: 29.6 pg (ref 26.0–34.0)
MCHC: 33.1 g/dL (ref 30.0–36.0)
MCV: 89.5 fL (ref 80.0–100.0)
Platelets: 171 K/uL (ref 150–400)
RBC: 5.31 MIL/uL (ref 4.22–5.81)
RDW: 12.5 % (ref 11.5–15.5)
WBC: 5.7 K/uL (ref 4.0–10.5)
nRBC: 0 % (ref 0.0–0.2)

## 2024-02-12 LAB — URINALYSIS, ROUTINE W REFLEX MICROSCOPIC
Bacteria, UA: NONE SEEN
Bilirubin Urine: NEGATIVE
Glucose, UA: NEGATIVE mg/dL
Ketones, ur: NEGATIVE mg/dL
Leukocytes,Ua: NEGATIVE
Nitrite: NEGATIVE
Protein, ur: 30 mg/dL — AB
RBC / HPF: >50 RBC/hpf (ref 0–5)
Specific Gravity, Urine: 1.026 (ref 1.005–1.030)
pH: 6 (ref 5.0–8.0)

## 2024-02-12 LAB — BASIC METABOLIC PANEL WITH GFR
Anion gap: 8 (ref 5–15)
BUN: 21 mg/dL (ref 8–23)
CO2: 31 mmol/L (ref 22–32)
Calcium: 9.9 mg/dL (ref 8.9–10.3)
Chloride: 104 mmol/L (ref 98–111)
Creatinine, Ser: 1 mg/dL (ref 0.61–1.24)
GFR, Estimated: >60 mL/min (ref >60)
Glucose, Bld: 99 mg/dL (ref 70–99)
Potassium: 4.5 mmol/L (ref 3.5–5.1)
Sodium: 143 mmol/L (ref 135–145)

## 2024-02-12 MED ORDER — IOHEXOL 350 MG/ML SOLN
100.0000 mL | Freq: Once | INTRAVENOUS | Status: AC | PRN
  Administered 2024-02-12: 100 mL via INTRAVENOUS

## 2024-02-12 NOTE — ED Provider Notes (Signed)
 [name] [facility] AT [facility] Provider Note   CSN: [id number] Arrival date & time: 02/12/24  1517     Patient presents with: Flank Pain   Mark Hansen is a 78 y.o. male.   HPI Patient has been getting very uncomfortable and sharp focal pains in his left lower anterior abdomen.  He indicates radiation towards the flank in the groin.  Reports it stays confined to the abdomen.  He reports when he is having the pain it is almost unbearable.  It is coming and fairly quick episodes.  Patient denies any radiation into his legs.  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 Silver.  Prior to that, patient had severe claudications in his buttocks and thighs.  Patient denies he is experiencing any shortness of breath or chest pain.  No syncope.  Patient reports pain is not radiating into his thighs buttocks or legs.  He is becoming concerned about the increased frequency and severity of the pain.  He had also spoken to his doctor who felt that he needed further evaluation, per the patient.    Prior to Admission medications   Medication Sig Start Date End Date Taking? Authorizing Provider  Ascorbic Acid (VITAMIN C) 100 MG tablet Take 100 mg by mouth daily.   Yes [provider]  aspirin  EC 81 MG tablet Take 81 mg by mouth daily. Swallow whole.   Yes [provider]  cilostazol  (PLETAL ) 100 MG tablet Take 100 mg by mouth 2 (two) times daily.   Yes [provider]  losartan  (COZAAR ) 25 MG tablet Take 25 mg by mouth daily.   Yes [provider]  vitamin E 180 MG (400 UNITS) capsule Take 400 Units by mouth daily.   Yes [provider]  losartan  (COZAAR ) 50 MG tablet Take 1 tablet (50 mg total) by mouth every morning. Patient not taking: Reported on 05/21/2022 02/24/22 03/26/22  Tolia, Sunit, DO  NIFEdipine  (PROCARDIA -XL/NIFEDICAL-XL) 30 MG 24 hr  tablet Take 1 tablet (30 mg total) by mouth daily. Patient not taking: Reported on 05/21/2022 12/26/21   Robins, Joshua E, MD  oxyCODONE -acetaminophen  (PERCOCET) 5-325 MG tablet Take 1 tablet by mouth every 6 (six) hours as needed for severe pain. Patient not taking: Reported on 07/03/2022 06/02/22   Rhyne, Samantha J, PA-C  rosuvastatin  (CRESTOR ) 20 MG tablet Take 1 tablet (20 mg total) by mouth at bedtime. 01/27/22 06/01/22  Tolia, Sunit, DO    Allergies: Patient has no known allergies.    Review of Systems  Updated Vital Signs BP (!) 154/82   Pulse 69   Temp 97.9 F (36.6 C) (Oral)   Resp 18   SpO2 100%   Physical Exam Constitutional:      Comments: Alert nontoxic clinically well in appearance.  No respiratory distress.  HENT:     Mouth/Throat:     Pharynx: Oropharynx is clear.  Eyes:     Extraocular Movements: Extraocular movements intact.  Cardiovascular:     Rate and Rhythm: Normal rate and regular rhythm.  Pulmonary:     Effort: Pulmonary effort is normal.     Breath sounds: Normal breath sounds.  Abdominal:     Comments: Abdomen soft.  No guarding.  No significantly reproducible pain.  The area in question is in the left lower quadrant.  No guarding or appreciable mass.  Musculoskeletal:  General: No swelling or tenderness. Normal range of motion.     Right lower leg: No edema.     Left lower leg: No edema.  Skin:    General: Skin is warm and dry.  Neurological:     General: No focal deficit present.     Mental Status: He is oriented to person, place, and time.     Motor: No weakness.     Coordination: Coordination normal.  Psychiatric:        Mood and Affect: Mood normal.     (all labs ordered are listed, but only abnormal results are displayed) Labs Reviewed  URINALYSIS, ROUTINE W REFLEX MICROSCOPIC - Abnormal; Notable for the following components:      Result Value   Color, Urine AMBER (*)    APPearance HAZY (*)    Hgb urine dipstick LARGE (*)     Protein, ur 30 (*)    All other components within normal limits  BASIC METABOLIC PANEL WITH GFR  CBC    EKG: None  Radiology: CT Angio Abd/Pel W and/or Wo Contrast Result Date: 02/12/2024 EXAM: CTA ABDOMEN AND PELVIS WITHOUT AND WITH CONTRAST 02/12/2024 08:01:41 PM TECHNIQUE: CTA images of the abdomen and pelvis without and with intravenous contrast. Three-dimensional MIP/volume rendered formations were performed. Automated exposure control, iterative reconstruction, and/or weight based adjustment of the mA/kV was utilized to reduce the radiation dose to as low as reasonably achievable. COMPARISON: CT abdomen and pelvis dated 02/26/2023 and CT abdomen and pelvis dated 01/05/2023. CLINICAL HISTORY: Abdominal aortic aneurysm (AAA), follow up. Left-sided pain right above the hip radiating into the left flank for 3 days. FINDINGS: VASCULATURE: AORTA: Postoperative change of aortobiiliac endograft. The stent graft is patent. Aortic atherosclerotic calcification. No aneurysm or dissection. No hemodynamically significant stenosis. CELIAC TRUNK: No acute finding. No occlusion or significant stenosis. SUPERIOR MESENTERIC ARTERY: No acute finding. No occlusion or significant stenosis. RENAL ARTERIES: No acute finding. No occlusion or significant stenosis. ILIAC ARTERIES: The iliac components of the aortobiiliac stent graft are patent. No hemodynamically significant stenosis. LIVER: The liver is unremarkable. GALLBLADDER AND BILE DUCTS: Gallbladder is unremarkable. No biliary ductal dilatation. SPLEEN: The spleen is unremarkable. PANCREAS: The pancreas is unremarkable. ADRENAL GLANDS: Bilateral adrenal glands demonstrate no acute abnormality. KIDNEYS, URETERS AND BLADDER: No stones in the kidneys or ureters. No hydronephrosis. No perinephric or periureteral stranding. 15 mm stone in the posterior right bladder. GI AND BOWEL: Stomach and duodenal sweep demonstrate no acute abnormality. There is no bowel obstruction.  No abnormal bowel wall thickening or distension. Moderate colonic stool burden. No evidence of GI bleeding. REPRODUCTIVE: Enlarged prostate. PERITONEUM AND RETRPERITONEUM: No ascites or free air. No evidence of active bleeding in the abdomen or pelvis. LUNG BASE: No acute abnormality. LYMPH NODES: No lymphadenopathy. BONES AND SOFT TISSUES: Intramedullary rod and screw fixation left femur. No acute soft tissue abnormality. IMPRESSION: 1. Patent aortobiiliac stent graft without aneurysm, dissection, or hemodynamically significant stenosis. 2. Constipation. 3. 15 mm bladder stone. 4. Prostatic enlargement. Electronically signed by: Norman Gatlin MD 02/12/2024 08:11 PM EDT RP Workstation: [id number]     Procedures   Medications Ordered in the ED  iohexol  (OMNIPAQUE ) 350 MG/ML injection 100 mL (100 mLs Intravenous Contrast Given 02/12/24 1949)                                    Medical Decision Making Amount and/or Complexity of Data Reviewed  Labs: ordered. Radiology: ordered.  Risk Prescription drug management.   Patient presents as outlined.  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  At this time patient does not have any lower extremity symptoms and is well in appearance.  Will have her proceed with CT angiogram to make sure there are no complications or other significant vascular dissections or obstructions.  Urinalysis grain 50 RBCs 0-5 WBC negative nitrate negative leuk esterase.  Metabolic panel normal with GFR greater than 60.  CBC normal.  CT angiogram does not show any acute vascular complications.  Patient's graft is stable.  Note is made of a 15 cm bladder stone and some constipation.  Patient does have some blood in the urine but no signs of infection.  He is getting a sharp but very short-lived pain in the left lower abdomen.  Possibly this is movement or positioning of the bladder stone.  Pain is not constant and is fairly brief.  I think  is less likely to be constipation under the circumstances.  I discussed the fact with the patient that bladder stones may or may not cause any symptoms and do not necessarily require treatment and lesser complications.  I do recommend follow-up with urology to discuss possibility that this is triggering sporadic focal pain.     Final diagnoses:  Left lower quadrant abdominal pain  Bladder stone  Hematuria, unspecified type    ED Discharge Orders     None          Armenta Canning, MD 02/12/24 2216

## 2024-02-12 NOTE — Discharge Instructions (Signed)
 1.  Follow-up with your urologist for recheck.  Your CT scan did not show any problems with your important blood vessels or other serious problems.  The CT scan did show a fairly large stone in your bladder.  Sometimes these do not cause any problems and people have no symptoms.  Sometimes there is pain associated with that.  Discuss this with your urologist to see if it might be causing your symptoms. 2.  Stay well-hydrated.  Return if you develop fever, increasing pain or other concerning symptoms.

## 2024-02-12 NOTE — ED Triage Notes (Signed)
 Patient in today reporting left sided pain right above hip radiating into left flank x3 days.

## 2024-02-15 DIAGNOSIS — N401 Enlarged prostate with lower urinary tract symptoms: Secondary | ICD-10-CM | POA: Diagnosis not present

## 2024-02-15 DIAGNOSIS — R3129 Other microscopic hematuria: Secondary | ICD-10-CM | POA: Diagnosis not present

## 2024-02-15 DIAGNOSIS — R399 Unspecified symptoms and signs involving the genitourinary system: Secondary | ICD-10-CM | POA: Diagnosis not present

## 2024-02-15 DIAGNOSIS — N210 Calculus in bladder: Secondary | ICD-10-CM | POA: Diagnosis not present

## 2024-03-31 DIAGNOSIS — N401 Enlarged prostate with lower urinary tract symptoms: Secondary | ICD-10-CM | POA: Diagnosis not present

## 2024-03-31 DIAGNOSIS — N210 Calculus in bladder: Secondary | ICD-10-CM | POA: Diagnosis not present

## 2024-03-31 DIAGNOSIS — R3912 Poor urinary stream: Secondary | ICD-10-CM | POA: Diagnosis not present
